# Patient Record
Sex: Female | Born: 2000 | Race: Black or African American | Hispanic: No | Marital: Single | Smoking: Never smoker
Health system: Southern US, Community
[De-identification: ages and names within clinical notes are randomized; demographics above are authoritative.]

## PROBLEM LIST (undated history)

## (undated) DIAGNOSIS — D573 Sickle-cell trait: Secondary | ICD-10-CM

## (undated) DIAGNOSIS — H6690 Otitis media, unspecified, unspecified ear: Secondary | ICD-10-CM

## (undated) DIAGNOSIS — T7840XA Allergy, unspecified, initial encounter: Secondary | ICD-10-CM

## (undated) HISTORY — DX: Allergy, unspecified, initial encounter: T78.40XA

## (undated) HISTORY — DX: Otitis media, unspecified, unspecified ear: H66.90

## (undated) HISTORY — DX: Sickle-cell trait: D57.3

## (undated) HISTORY — PX: NO PAST SURGERIES: SHX2092

---
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
## 2019-02-19 ENCOUNTER — Ambulatory Visit: Payer: 59 | Admitting: Family Medicine

## 2019-03-14 ENCOUNTER — Other Ambulatory Visit: Payer: Self-pay

## 2019-03-14 DIAGNOSIS — Z20822 Contact with and (suspected) exposure to covid-19: Secondary | ICD-10-CM

## 2019-03-16 LAB — NOVEL CORONAVIRUS, NAA: SARS-CoV-2, NAA: NOT DETECTED

## 2019-03-21 ENCOUNTER — Ambulatory Visit: Payer: 59 | Admitting: Family Medicine

## 2019-04-02 ENCOUNTER — Other Ambulatory Visit: Payer: Self-pay

## 2019-04-02 ENCOUNTER — Other Ambulatory Visit (HOSPITAL_COMMUNITY)
Admission: RE | Admit: 2019-04-02 | Discharge: 2019-04-02 | Disposition: A | Discharge status: Home / Self Care | Payer: 59 | Source: Ambulatory Visit | Attending: Family Medicine | Admitting: Family Medicine

## 2019-04-02 ENCOUNTER — Encounter: Payer: Self-pay | Admitting: Family Medicine

## 2019-04-02 ENCOUNTER — Ambulatory Visit (INDEPENDENT_AMBULATORY_CARE_PROVIDER_SITE_OTHER): Payer: 59 | Admitting: Family Medicine

## 2019-04-02 VITALS — BP 128/81 | HR 83 | Temp 98.0°F | Resp 16 | Ht 61.03 in | Wt 157.6 lb

## 2019-04-02 DIAGNOSIS — Z114 Encounter for screening for human immunodeficiency virus [HIV]: Secondary | ICD-10-CM | POA: Diagnosis not present

## 2019-04-02 DIAGNOSIS — Z23 Encounter for immunization: Secondary | ICD-10-CM | POA: Diagnosis not present

## 2019-04-02 DIAGNOSIS — Z0001 Encounter for general adult medical examination with abnormal findings: Secondary | ICD-10-CM

## 2019-04-02 DIAGNOSIS — D242 Benign neoplasm of left breast: Secondary | ICD-10-CM

## 2019-04-02 DIAGNOSIS — N898 Other specified noninflammatory disorders of vagina: Secondary | ICD-10-CM

## 2019-04-02 DIAGNOSIS — Z124 Encounter for screening for malignant neoplasm of cervix: Secondary | ICD-10-CM

## 2019-04-02 DIAGNOSIS — Z0000 Encounter for general adult medical examination without abnormal findings: Secondary | ICD-10-CM

## 2019-04-02 DIAGNOSIS — Z113 Encounter for screening for infections with a predominantly sexual mode of transmission: Secondary | ICD-10-CM

## 2019-04-02 LAB — POCT WET + KOH PREP
Trich by wet prep: ABSENT
Yeast by wet prep: ABSENT

## 2019-04-02 MED ORDER — FLUCONAZOLE 150 MG PO TABS: 150.0000 mg | ORAL_TABLET | Freq: Once | ORAL | 0 refills | Status: AC

## 2019-04-02 MED ORDER — METRONIDAZOLE 500 MG PO TABS: 500.0000 mg | ORAL_TABLET | Freq: Two times a day (BID) | ORAL | 0 refills | Status: DC

## 2019-04-02 NOTE — Patient Instructions (Signed)
Immunization Schedule, 2-19 Years Old Vaccines are usually given at various ages, according to a schedule. Your health care provider will recommend vaccines for you based on your age, medical history, and lifestyle or other factors, such as travel or where you work. You may receive vaccines as individual doses or as more than one vaccine together in one shot (combination vaccine). Talk with your health care provider about the risks and benefits of combination vaccines. Before you get a vaccine: Talk with your health care provider about which vaccines are right for you. This is especially important if:  You previously had a reaction after getting a vaccine.  You have a weakened disease-fighting system (immune system). You may have a weakened immune system if you: ? Are taking medicines that reduce (suppress) the activity of your immune system. ? Are taking medicines to treat cancer (chemotherapy). ? Have HIV or AIDS.  You work in an environment where you may be exposed to a disease.  You plan to travel outside of the country.  You have a chronic illness, such as heart disease, kidney disease, diabetes, or lung disease. Recommended immunizations for 33-100 years old Influenza vaccine This may also be called the IIV, RIV, or LAIV vaccine.  You should get a dose of the influenza vaccine every year. Tetanus, diphtheria, and pertussis vaccine This is also known as the Tdap vaccine. You should get 1 dose of the Tdap vaccine if:  You have not previously gotten a Tdap vaccine.  You do not know if you have ever gotten a Tdap vaccine. Women should get 1 dose of the Tdap vaccine during each pregnancy. It is recommended that pregnant women receive this vaccine between 27 and 36 weeks of pregnancy. Tetanus, diphtheria vaccine This is also known as the Td vaccine.  You should get a dose of the Td vaccine every 10 years after 1 dose of Tdap vaccine. Measles, mumps, and rubella vaccine This is  also known as the MMR vaccine. You may need to get the MMR vaccine if:  You need to catch up on doses you missed in the past.  You have not been given the vaccine before.  You do not have evidence of immunity (by a blood test). Pregnant women should not get the MMR vaccine during pregnancy because it may be harmful to the unborn baby. However, if you are not immune to measles, mumps, or rubella, you should get a dose of MMR vaccine within days after delivery. Varicella vaccine This is also known as the VAR vaccine. You may need to get the VAR vaccine if:  You need to catch up on doses you missed in the past.  You have not been given the vaccine before.  You do not have evidence of immunity (by a blood test).  You have certain high-risk conditions, such as HIV or AIDS. Pregnant women should not get the VAR vaccine during pregnancy because it may be harmful to the unborn baby. However, if you are not immune to chickenpox (varicella), you should get a dose of the VAR vaccine within days after delivery. Human papillomavirus vaccine This is also known as the HPV vaccine. You should get the HPV vaccine if:  You have not gotten the vaccine before. The vaccine is recommended for all adults through age 24.  You need to catch up on doses you missed in the past. Pneumococcal conjugate vaccine This is also known as the PCV13 vaccine. You should get the PCV13 vaccine as recommended if you  have certain high-risk conditions. These include:  Diabetes.  Chronic conditions of the heart, lungs, or liver.  Conditions that affect the immune system. Pneumococcal polysaccharide vaccine This is also known as the PPSV23 vaccine. You should get the PPSV23 vaccine as recommended if you have certain high-risk conditions. These include:  Diabetes.  Chronic conditions of the heart, lungs, or liver.  Conditions that affect the immune system. Hepatitis A vaccine This is also known as the HepA vaccine.  If you did not get the HepA vaccine previously, you should get it if:  You are at risk for a hepatitis A infection. You may be at risk for infection if you: ? Have chronic liver disease. ? Have HIV or AIDS. ? Are a man who has sex with men. ? Use illegal drugs. ? Are homeless. ? May be exposed to hepatitis A through work. ? Travel to countries where hepatitis A is common. ? Are pregnant. ? Have or will have close contact with someone who was adopted from another country.  You are not at risk for infection but want protection from hepatitis A. Hepatitis B vaccine This is also known as the HepB vaccine. If you did not get the HepB vaccine previously, you should get it if:  You are at risk for hepatitis B infection. You are at risk if you: ? Have chronic liver disease. ? Have HIV or AIDS. ? Have sex with a partner who has hepatitis B, or:  You have multiple sex partners.  You are a man who has sex with men. ? Use illegal drugs. ? May be exposed to hepatitis B through work. ? Live with someone who has hepatitis B. ? Receive dialysis treatment. ? Have diabetes mellitus. ? Travel to countries where hepatitis B is common. ? Are pregnant.  You are not at risk of infection but want protection from hepatitis B. Meningococcal conjugate vaccine This is also known as the MenACWY vaccine. You may need to get the MenACWY vaccine if you:  Have not been given the vaccine before.  Need to catch up on doses you missed in the past. This vaccine is especially important if you:  Do not have a spleen.  Have sickle cell disease.  Have HIV.  Take medicines that suppress your immune system.  Travel to countries where meningococcal disease is common.  Are exposed to Neisseria meningitidis at work. Serogroup B meningococcal vaccine This is also known as the MenB vaccine. You may need to get the MenB vaccine if you:  Have not been given the vaccine before.  Need to catch up on doses  you missed in the past. This vaccine is especially important if you:  Do not have a spleen.  Have sickle cell disease.  Take medicines that suppress your immune system.  Are exposed to Neisseria meningitidis at work. Haemophilus influenzae type b vaccine This is also known as the Hib vaccine. Anyone older than 19 years of age is usually not given the Hib vaccine. However, if you have certain high-risk conditions, you may need to get this vaccine. These conditions include:  Not having a spleen.  Having received a stem cell transplant. Summary  Before you get a vaccine, tell your health care provider if you have reacted to vaccines in the past or have a condition that weakens your immune system.  At 19-21 years, you should get a dose of the flu vaccine every year and a dose of the Td vaccine if 10 years have passed since  you had a Tdap vaccine.  Depending on your medical history and your risk factors, you may also need other vaccines. Ask your health care provider whether you are up to date on all your vaccines.  Women who are pregnant may not receive certain vaccines. Ask your health care provider whether you should receive any vaccines soon after you deliver your baby. This information is not intended to replace advice given to you by your health care provider. Make sure you discuss any questions you have with your health care provider. Document Released: 06/26/2003 Document Revised: 07/27/2018 Document Reviewed: 06/23/2018 [employer] Patient Education  [place].

## 2019-04-02 NOTE — Progress Notes (Signed)
Chief Complaint  Patient presents with  . Annual Exam    cpe w/ pap    Subjective:  Lynn Roth is a 18 y.o. female here for a health maintenance visit.  Patient is established pt  She would like to get a follow up for her breast lump on the left side.  She denies any changes in the lump.   No nipple discharge   She has a vaginal discharge   Patient Active Problem List   Diagnosis Date Noted  . Left breast lump 04/13/2018  . Chronic allergic rhinitis 04/13/2018  . Nexplanon in place 04/13/2018  . Seasonal allergies 04/05/2012  . Eczema 04/05/2012    Past Medical History:  Diagnosis Date  . Allergy started 08/04/2007  . Otitis media 03/19/2005    No past surgical history on file.   Outpatient Medications Prior to Visit  Medication Sig Dispense Refill  . Azelastine HCl 0.15 % SOLN Place 2 sprays into both nostrils 2 (two) times daily. 30 mL 5  . levocetirizine (XYZAL) 5 MG tablet Take 1 tablet (5 mg total) by mouth every evening. 30 tablet 11  . Azelastine-Fluticasone (DYMISTA) 137-50 MCG/ACT SUSP Place 2 sprays into both nostrils 1 day or 1 dose. (Patient not taking: Reported on 04/02/2019) 1 Bottle 5  . fluticasone (FLONASE) 50 MCG/ACT nasal spray Place 2 sprays into both nostrils daily. (Patient not taking: Reported on 04/02/2019) 16 g 5   No facility-administered medications prior to visit.    No Known Allergies   Family History  Problem Relation Age of Onset  . Asthma Brother   . Hypertension Mother 32       medication.  . Hypertension Maternal Aunt   . Sickle cell anemia Maternal Uncle   . Diabetes Maternal Grandmother   . Hypertension Maternal Grandfather   . Vision loss Maternal Grandfather        glacoma  . Hypertension Paternal Grandmother   . Kidney disease Neg Hx   . Stroke Neg Hx   . Hyperlipidemia Neg Hx      Health Habits: Dental Exam: up to date Eye Exam: up to date Exercise: 3 times/week on average Current exercise activities:  walking/running Diet: balanced  Social History   Socioeconomic History  . Marital status: Single    Spouse name: Not on file  . Number of children: Not on file  . Years of education: Not on file  . Highest education level: Not on file  Occupational History  . Not on file  Tobacco Use  . Smoking status: Never Smoker  . Smokeless tobacco: Never Used  Substance and Sexual Activity  . Alcohol use: No  . Drug use: No  . Sexual activity: Never  Other Topics Concern  . Not on file  Social History Narrative   [employer]   7th grade   Basketball and cheerleading.   Social Determinants of Health   Financial Resource Strain:   . Difficulty of Paying Living Expenses: Not on file  Food Insecurity:   . Worried About Charity fundraiser in the Last Year: Not on file  . Ran Out of Food in the Last Year: Not on file  Transportation Needs:   . Lack of Transportation (Medical): Not on file  . Lack of Transportation (Non-Medical): Not on file  Physical Activity:   . Days of Exercise per Week: Not on file  . Minutes of Exercise per Session: Not on file  Stress:   . Feeling  of Stress : Not on file  Social Connections:   . Frequency of Communication with Friends and Family: Not on file  . Frequency of Social Gatherings with Friends and Family: Not on file  . Attends Religious Services: Not on file  . Active Member of Clubs or Organizations: Not on file  . Attends Archivist Meetings: Not on file  . Marital Status: Not on file  Intimate Partner Violence:   . Fear of Current or Ex-Partner: Not on file  . Emotionally Abused: Not on file  . Physically Abused: Not on file  . Sexually Abused: Not on file   Social History   Substance and Sexual Activity  Alcohol Use No   Social History   Tobacco Use  Smoking Status Never Smoker  Smokeless Tobacco Never Used   Social History   Substance and Sexual Activity  Drug Use No    GYN: Sexual Health Menstrual  status: regular menses LMP: Patient's last menstrual period was 03/04/2019. Last pap smear: see HM section History of abnormal pap smears:  Sexually active:  with female partner Current contraception: nexplanon  Health Maintenance: See under health Maintenance activity for review of completion dates as well. Immunization History  Administered Date(s) Administered  . DTaP December 08, 1999, 12/11/1999, 02/15/2000, 11/28/2000, 11/08/2003  . Hepatitis A 08/04/2007, 12/31/2008  . Hepatitis B Apr 09, 2000, 11/09/1999, 07/18/2000  . HiB (PRP-OMP) 12/11/1999, 02/15/2000, 03/28/2000, 10/10/2000  . IPV 12/11/1999, 02/15/2000, 10/10/2000, 11/28/2000  . Influenza Split 01/07/2011, 01/05/2012  . Influenza,inj,Quad PF,6+ Mos 01/28/2018, 04/02/2019  . MMR 10/10/2000, 11/08/2003  . Meningococcal Conjugate 01/05/2012  . Pneumococcal Conjugate-13 12/11/1999, 02/15/2000, 03/28/2000  . Tdap 12/03/2010  . Varicella 10/10/2000, 08/04/2007      Depression Screen-PHQ2/9 Depression screen [facility] 2/9 04/02/2019 04/13/2018  Decreased Interest 0 0  Down, Depressed, Hopeless 0 0  PHQ - 2 Score 0 0       Depression Severity and Treatment Recommendations:  0-4= None  5-9= Mild / Treatment: Support, educate to call if worse; return in one month  10-14= Moderate / Treatment: Support, watchful waiting; Antidepressant or Psycotherapy  15-19= Moderately severe / Treatment: Antidepressant OR Psychotherapy  >= 20 = Major depression, severe / Antidepressant AND Psychotherapy    Review of Systems   ROS  See HPI for ROS as well.   Review of Systems  Constitutional: Negative for activity change, appetite change, chills and fever.  HENT: Negative for congestion, nosebleeds, trouble swallowing and voice change.   Respiratory: Negative for cough, shortness of breath and wheezing.   Gastrointestinal: Negative for diarrhea, nausea and vomiting.  Genitourinary: Negative for difficulty urinating, dysuria, flank pain and  hematuria.  Musculoskeletal: Negative for back pain, joint swelling and neck pain.  Neurological: Negative for dizziness, speech difficulty, light-headedness and numbness.  See HPI. All other review of systems negative.    Objective:   Vitals:   04/02/19 1342  BP: 128/81  Pulse: 83  Resp: 16  Temp: 98 F (36.7 C)  TempSrc: Oral  SpO2: 99%  Weight: 157 lb 9.6 oz (71.5 kg)  Height: 5' 1.03" (1.55 m)    Body mass index is 29.75 kg/m.  Physical Exam Exam conducted with a chaperone present.  Constitutional:      Appearance: Normal appearance. She is normal weight.  HENT:     Head: Normocephalic and atraumatic.     Nose: Nose normal. No congestion.  Eyes:     Extraocular Movements: Extraocular movements intact.     Conjunctiva/sclera: Conjunctivae normal.  Cardiovascular:     Rate and Rhythm: Normal rate and regular rhythm.     Pulses: Normal pulses.  Pulmonary:     Effort: Pulmonary effort is normal. No respiratory distress.     Breath sounds: Normal breath sounds. No stridor. No wheezing, rhonchi or rales.  Chest:     Chest wall: No tenderness.  Abdominal:     General: Abdomen is flat. Bowel sounds are normal. There is no distension.     Palpations: Abdomen is soft. There is no mass.     Tenderness: There is no abdominal tenderness. There is no guarding or rebound.     Hernia: No hernia is present.  Skin:    General: Skin is warm.     Capillary Refill: Capillary refill takes less than 2 seconds.  Neurological:     Mental Status: She is alert and oriented to person, place, and time. Mental status is at baseline.  Psychiatric:        Mood and Affect: Mood normal.        Behavior: Behavior normal.        Thought Content: Thought content normal.        Judgment: Judgment normal.   Breast and GU exam  Breast with palpable mass about 1cm-2cm at 1 o'clock Without lymph node pain  Vaginal exam- Chaperone Present Labia normal bilaterally without skin lesions  Urethral meatus normal appearing without erythema Vagina with thick white discharge No CMT, ovaries small and not palpable Uterus midline, nontender     Assessment/Plan:   Patient was seen for a health maintenance exam.  Counseled the patient on health maintenance issues. Reviewed her health mainteance schedule and ordered appropriate tests (see orders.) Counseled on regular exercise and weight management. Recommend regular eye exams and dental cleaning.   The following issues were addressed today for health maintenance:   Kattie was seen today for annual exam.  Diagnoses and all orders for this visit:  Encounter for annual physical examination excluding gynecological examination in a patient older than 5 years- Women's Health Maintenance Plan Advised monthly breast exam and follow up mammogram  Advised dental exam every six months Discussed stress management Discussed pap smear screening guidelines   Screening for HIV (human immunodeficiency virus) -     HIV antibody (with reflex)  Need for prophylactic vaccination and inoculation against influenza -     Flu Vaccine QUAD 36+ mos IM  Screening for cervical cancer- discussed that she is not due for cervical cancer screening until age 52  Screen for STD (sexually transmitted disease) -     HIV antibody (with reflex) -     Hepatitis B surface antigen -     RPR  Vaginal discharge- will screen for stds Wet prep showed BV and yeast Treatment described -     Hepatitis B surface antigen -     GC/Chlamydia probe amp ([place])not at [facility] -     POCT Wet + KOH Prep  Fibroadenoma of breast, left -  Continue to monitor with regular breast exam -     US BREAST LTD UNI LEFT INC AXILLA; Future    No follow-ups on file.    Body mass index is 29.75 kg/m.:  Discussed the patient's BMI with patient. The BMI body mass index is 29.75 kg/m.     Future Appointments  Date Time Provider [place]  04/25/2019 10:30 AM  [facility] [place] [facility]    Patient Instructions  Immunization Schedule, 19-21  Years Old Vaccines are usually given at various ages, according to a schedule. Your health care provider will recommend vaccines for you based on your age, medical history, and lifestyle or other factors, such as travel or where you work. You may receive vaccines as individual doses or as more than one vaccine together in one shot (combination vaccine). Talk with your health care provider about the risks and benefits of combination vaccines. Before you get a vaccine: Talk with your health care provider about which vaccines are right for you. This is especially important if:  You previously had a reaction after getting a vaccine.  You have a weakened disease-fighting system (immune system). You may have a weakened immune system if you: ? Are taking medicines that reduce (suppress) the activity of your immune system. ? Are taking medicines to treat cancer (chemotherapy). ? Have HIV or AIDS.  You work in an environment where you may be exposed to a disease.  You plan to travel outside of the country.  You have a chronic illness, such as heart disease, kidney disease, diabetes, or lung disease. Recommended immunizations for 45-88 years old Influenza vaccine This may also be called the IIV, RIV, or LAIV vaccine.  You should get a dose of the influenza vaccine every year. Tetanus, diphtheria, and pertussis vaccine This is also known as the Tdap vaccine. You should get 1 dose of the Tdap vaccine if:  You have not previously gotten a Tdap vaccine.  You do not know if you have ever gotten a Tdap vaccine. Women should get 1 dose of the Tdap vaccine during each pregnancy. It is recommended that pregnant women receive this vaccine between 27 and 36 weeks of pregnancy. Tetanus, diphtheria vaccine This is also known as the Td vaccine.  You should get a dose of the Td vaccine every 10 years after 1 dose of  Tdap vaccine. Measles, mumps, and rubella vaccine This is also known as the MMR vaccine. You may need to get the MMR vaccine if:  You need to catch up on doses you missed in the past.  You have not been given the vaccine before.  You do not have evidence of immunity (by a blood test). Pregnant women should not get the MMR vaccine during pregnancy because it may be harmful to the unborn baby. However, if you are not immune to measles, mumps, or rubella, you should get a dose of MMR vaccine within days after delivery. Varicella vaccine This is also known as the VAR vaccine. You may need to get the VAR vaccine if:  You need to catch up on doses you missed in the past.  You have not been given the vaccine before.  You do not have evidence of immunity (by a blood test).  You have certain high-risk conditions, such as HIV or AIDS. Pregnant women should not get the VAR vaccine during pregnancy because it may be harmful to the unborn baby. However, if you are not immune to chickenpox (varicella), you should get a dose of the VAR vaccine within days after delivery. Human papillomavirus vaccine This is also known as the HPV vaccine. You should get the HPV vaccine if:  You have not gotten the vaccine before. The vaccine is recommended for all adults through age 55.  You need to catch up on doses you missed in the past. Pneumococcal conjugate vaccine This is also known as the PCV13 vaccine. You should get the PCV13 vaccine as recommended if you have certain high-risk  conditions. These include:  Diabetes.  Chronic conditions of the heart, lungs, or liver.  Conditions that affect the immune system. Pneumococcal polysaccharide vaccine This is also known as the PPSV23 vaccine. You should get the PPSV23 vaccine as recommended if you have certain high-risk conditions. These include:  Diabetes.  Chronic conditions of the heart, lungs, or liver.  Conditions that affect the immune system.  Hepatitis A vaccine This is also known as the HepA vaccine. If you did not get the HepA vaccine previously, you should get it if:  You are at risk for a hepatitis A infection. You may be at risk for infection if you: ? Have chronic liver disease. ? Have HIV or AIDS. ? Are a man who has sex with men. ? Use illegal drugs. ? Are homeless. ? May be exposed to hepatitis A through work. ? Travel to countries where hepatitis A is common. ? Are pregnant. ? Have or will have close contact with someone who was adopted from another country.  You are not at risk for infection but want protection from hepatitis A. Hepatitis B vaccine This is also known as the HepB vaccine. If you did not get the HepB vaccine previously, you should get it if:  You are at risk for hepatitis B infection. You are at risk if you: ? Have chronic liver disease. ? Have HIV or AIDS. ? Have sex with a partner who has hepatitis B, or:  You have multiple sex partners.  You are a man who has sex with men. ? Use illegal drugs. ? May be exposed to hepatitis B through work. ? Live with someone who has hepatitis B. ? Receive dialysis treatment. ? Have diabetes mellitus. ? Travel to countries where hepatitis B is common. ? Are pregnant.  You are not at risk of infection but want protection from hepatitis B. Meningococcal conjugate vaccine This is also known as the MenACWY vaccine. You may need to get the MenACWY vaccine if you:  Have not been given the vaccine before.  Need to catch up on doses you missed in the past. This vaccine is especially important if you:  Do not have a spleen.  Have sickle cell disease.  Have HIV.  Take medicines that suppress your immune system.  Travel to countries where meningococcal disease is common.  Are exposed to Neisseria meningitidis at work. Serogroup B meningococcal vaccine This is also known as the MenB vaccine. You may need to get the MenB vaccine if you:  Have  not been given the vaccine before.  Need to catch up on doses you missed in the past. This vaccine is especially important if you:  Do not have a spleen.  Have sickle cell disease.  Take medicines that suppress your immune system.  Are exposed to Neisseria meningitidis at work. Haemophilus influenzae type b vaccine This is also known as the Hib vaccine. Anyone older than 19 years of age is usually not given the Hib vaccine. However, if you have certain high-risk conditions, you may need to get this vaccine. These conditions include:  Not having a spleen.  Having received a stem cell transplant. Summary  Before you get a vaccine, tell your health care provider if you have reacted to vaccines in the past or have a condition that weakens your immune system.  At 19-21 years, you should get a dose of the flu vaccine every year and a dose of the Td vaccine if 10 years have passed since you had a  Tdap vaccine.  Depending on your medical history and your risk factors, you may also need other vaccines. Ask your health care provider whether you are up to date on all your vaccines.  Women who are pregnant may not receive certain vaccines. Ask your health care provider whether you should receive any vaccines soon after you deliver your baby. This information is not intended to replace advice given to you by your health care provider. Make sure you discuss any questions you have with your health care provider. Document Released: 06/26/2003 Document Revised: 07/27/2018 Document Reviewed: 06/23/2018 Elsevier Patient Education  [place].

## 2019-04-03 LAB — GC/CHLAMYDIA PROBE AMP (~~LOC~~) NOT AT ARMC
Chlamydia: NEGATIVE
Comment: NEGATIVE
Comment: NORMAL
Neisseria Gonorrhea: NEGATIVE

## 2019-04-03 LAB — HEPATITIS B SURFACE ANTIGEN: Hepatitis B Surface Ag: NEGATIVE

## 2019-04-03 LAB — HIV ANTIBODY (ROUTINE TESTING W REFLEX): HIV Screen 4th Generation wRfx: NONREACTIVE

## 2019-04-03 LAB — RPR: RPR Ser Ql: NONREACTIVE

## 2019-04-25 ENCOUNTER — Other Ambulatory Visit: Payer: 59

## 2019-04-27 ENCOUNTER — Telehealth (INDEPENDENT_AMBULATORY_CARE_PROVIDER_SITE_OTHER): Payer: 59 | Admitting: Family Medicine

## 2019-04-27 ENCOUNTER — Encounter: Payer: Self-pay | Admitting: Family Medicine

## 2019-04-27 ENCOUNTER — Other Ambulatory Visit: Payer: Self-pay

## 2019-04-27 VITALS — Temp 98.0°F | Ht 61.0 in

## 2019-04-27 DIAGNOSIS — U071 COVID-19: Secondary | ICD-10-CM

## 2019-04-27 DIAGNOSIS — J309 Allergic rhinitis, unspecified: Secondary | ICD-10-CM | POA: Diagnosis not present

## 2019-04-27 DIAGNOSIS — R0981 Nasal congestion: Secondary | ICD-10-CM | POA: Diagnosis not present

## 2019-04-27 MED ORDER — LEVOCETIRIZINE DIHYDROCHLORIDE 5 MG PO TABS: 5.0000 mg | ORAL_TABLET | Freq: Every evening | ORAL | 11 refills | Status: DC

## 2019-04-27 NOTE — Progress Notes (Signed)
DOXY.ME VIDEO Encounter- SOAP NOTE Established Patient  This DOXY.ME encounter was conducted with the patient's (or proxy's) verbal consent via audio telecommunications: yes/no: Yes Patient was instructed to have this encounter in a suitably private space; and to only have persons present to whom they give permission to participate. In addition, patient identity was confirmed by use of name plus two identifiers (DOB and address).  I discussed the limitations, risks, security and privacy concerns of performing an evaluation and management service by telephone and the availability of in person appointments. I also discussed with the patient that there may be a patient responsible charge related to this service. The patient expressed understanding and agreed to proceed.  I spent a total of TIME; 0 MIN TO 60 MIN: 15 minutes talking with the patient or their proxy.  CC: COVID symptoms  Subjective   Lynn Roth is a 19 y.o. established patient. Telephone visit today for  HPI  Patient tested positive for covid 04/24/2019 She was tested at [facility] Her symptoms started off as a cold  She was experiencing runny nose, sneezing, congestion, cough Her symptoms started 04/18/2019. She denies fevers She reports lack of taste or smell She is still congested She states like she has fluid in her head - it feels heavy but not quite a headache - like after she was in the pool.    Patient Active Problem List   Diagnosis Date Noted  . Left breast lump 04/13/2018  . Chronic allergic rhinitis 04/13/2018  . Nexplanon in place 04/13/2018  . Seasonal allergies 04/05/2012  . Eczema 04/05/2012    Past Medical History:  Diagnosis Date  . Allergy started 08/04/2007  . Otitis media 03/19/2005    Current Outpatient Medications  Medication Sig Dispense Refill  . Azelastine HCl 0.15 % SOLN Place 2 sprays into both nostrils 2 (two) times daily. 30 mL 5  . levocetirizine (XYZAL) 5 MG tablet Take 1 tablet  (5 mg total) by mouth every evening. 30 tablet 11  . metroNIDAZOLE (FLAGYL) 500 MG tablet Take 1 tablet (500 mg total) by mouth 2 (two) times daily. (Patient not taking: Reported on 04/27/2019) 14 tablet 0   No current facility-administered medications for this visit.    No Known Allergies  Social History   Socioeconomic History  . Marital status: Single    Spouse name: Not on file  . Number of children: Not on file  . Years of education: Not on file  . Highest education level: Not on file  Occupational History  . Not on file  Tobacco Use  . Smoking status: Never Smoker  . Smokeless tobacco: Never Used  Substance and Sexual Activity  . Alcohol use: No  . Drug use: No  . Sexual activity: Never  Other Topics Concern  . Not on file  Social History Narrative   [employer]   7th grade   Basketball and cheerleading.   Social Determinants of Health   Financial Resource Strain:   . Difficulty of Paying Living Expenses: Not on file  Food Insecurity:   . Worried About Charity fundraiser in the Last Year: Not on file  . Ran Out of Food in the Last Year: Not on file  Transportation Needs:   . Lack of Transportation (Medical): Not on file  . Lack of Transportation (Non-Medical): Not on file  Physical Activity:   . Days of Exercise per Week: Not on file  . Minutes of Exercise per Session:  Not on file  Stress:   . Feeling of Stress : Not on file  Social Connections:   . Frequency of Communication with Friends and Family: Not on file  . Frequency of Social Gatherings with Friends and Family: Not on file  . Attends Religious Services: Not on file  . Active Member of Clubs or Organizations: Not on file  . Attends Archivist Meetings: Not on file  . Marital Status: Not on file  Intimate Partner Violence:   . Fear of Current or Ex-Partner: Not on file  . Emotionally Abused: Not on file  . Physically Abused: Not on file  . Sexually Abused: Not on file     ROS Review of Systems  Constitutional: Negative for activity change, appetite change, chills and fever.  HENT: +congestion, no nosebleeds, trouble swallowing and voice change.   Respiratory: Negative for cough, shortness of breath and wheezing.   Gastrointestinal: Negative for diarrhea, nausea and vomiting. + loose stools Genitourinary: Negative for difficulty urinating, dysuria, flank pain and hematuria.  Musculoskeletal: Negative for back pain, joint swelling and neck pain.  Neurological: Negative for dizziness, speech difficulty, light-headedness and numbness.  See HPI. All other review of systems negative.   Objective   Vitals as reported by the patient: Today's Vitals   04/27/19 0911  Temp: 98 F (36.7 C)  TempSrc: Temporal  Height: 5\' 1"  (1.549 m)   Physical Exam  Constitutional: Oriented to person, place, and time. Appears well-developed and well-nourished.  HENT:  Head: Normocephalic and atraumatic.  Eyes: Conjunctivae and EOM are normal.  Pulmonary/Chest: Effort normal Neurological: Is alert and oriented to person, place, and time.  Skin: Skin is warm. Capillary refill takes less than 2 seconds.  Psychiatric: Has a normal mood and affect. Behavior is normal. Judgment and thought content normal.   Diagnoses and all orders for this visit:  COVID-19 virus infection  Nasal congestion   Discussed symptomatic treatment with xyzal and nasal spray Continue rest and hydration Gave work excuse note   I discussed the assessment and treatment plan with the patient. The patient was provided an opportunity to ask questions and all were answered. The patient agreed with the plan and demonstrated an understanding of the instructions.   The patient was advised to call back or seek an in-person evaluation if the symptoms worsen or if the condition fails to improve as anticipated.  I provided 15  minutes of doxy.me face-to-face time during this encounter.  Forrest Moron, MD  Primary Care at [facility]

## 2019-04-27 NOTE — Progress Notes (Signed)
Lingering Covid Sx. (going on since Sunday)  DX: test pos. on Tue 04/24/2019  ~ feels like she has swimmers ear ~no taste or smell.

## 2019-04-27 NOTE — Patient Instructions (Signed)
° ° ° °  If you have lab work done today you will be contacted with your lab results within the next 2 weeks.  If you have not heard from us then please contact us. The fastest way to get your results is to register for My Chart. ° ° °IF you received an x-ray today, you will receive an invoice from [facility]. Please contact [facility] at [phone] with questions or concerns regarding your invoice.  ° °IF you received labwork today, you will receive an invoice from [facility]. Please contact [facility] at [phone] with questions or concerns regarding your invoice.  ° °Our billing staff will not be able to assist you with questions regarding bills from these companies. ° °You will be contacted with the lab results as soon as they are available. The fastest way to get your results is to activate your My Chart account. Instructions are located on the last page of this paperwork. If you have not heard from us regarding the results in 2 weeks, please contact this office. °  ° ° ° °

## 2019-05-01 ENCOUNTER — Other Ambulatory Visit: Payer: 59

## 2019-05-17 ENCOUNTER — Other Ambulatory Visit: Payer: 59

## 2019-06-11 ENCOUNTER — Ambulatory Visit
Admission: RE | Admit: 2019-06-11 | Discharge: 2019-06-11 | Disposition: A | Discharge status: Home / Self Care | Payer: 59 | Source: Ambulatory Visit | Attending: Family Medicine | Admitting: Family Medicine

## 2019-06-11 ENCOUNTER — Other Ambulatory Visit: Payer: Self-pay

## 2019-06-11 DIAGNOSIS — D242 Benign neoplasm of left breast: Secondary | ICD-10-CM

## 2019-07-23 IMAGING — US ULTRASOUND LEFT BREAST LIMITED
1 series · 4 of 4 positions shown · non-contrast
Comparison: December 08, 2016 and June 23, 2017

CLINICAL DATA: 17-year-old patient presents for follow-up
ultrasound imaging of a probable fibroadenoma in the left breast 11
o'clock position retroareolar. This was first imaged in November 2016.
The patient reports no change or new concerns.

EXAM:
ULTRASOUND OF THE LEFT BREAST

[Series 1: ultrasound left breast limited · 0.06mm/px · 4 of 4 slices shown]
[im 1/4]
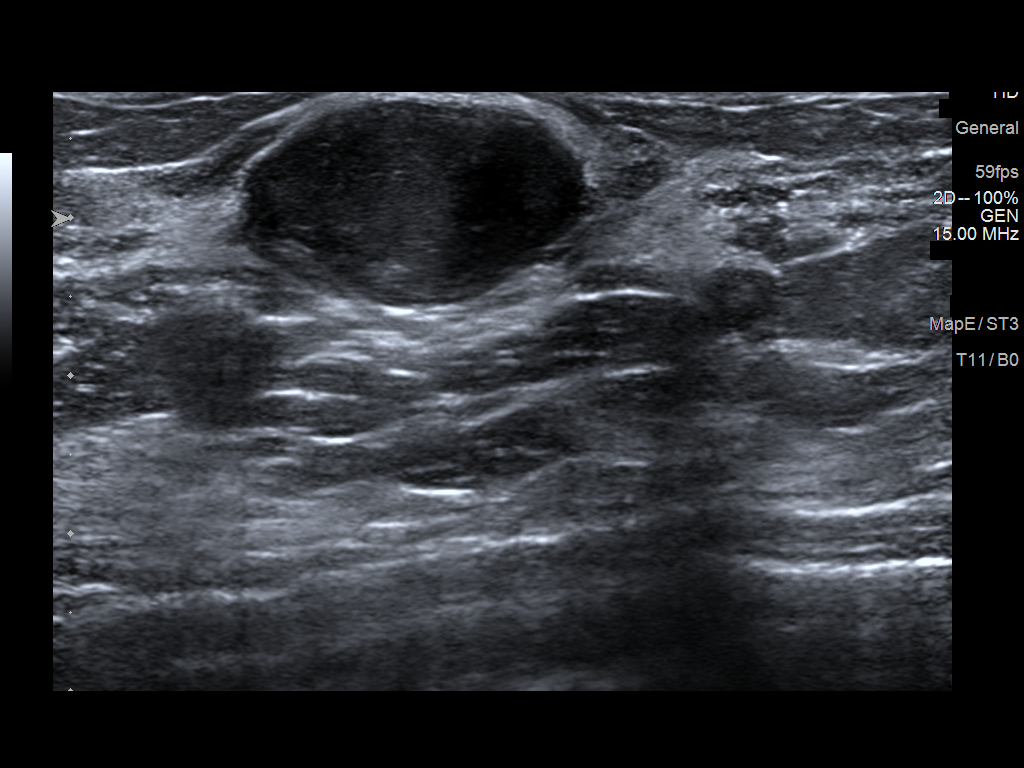
[im 2/4]
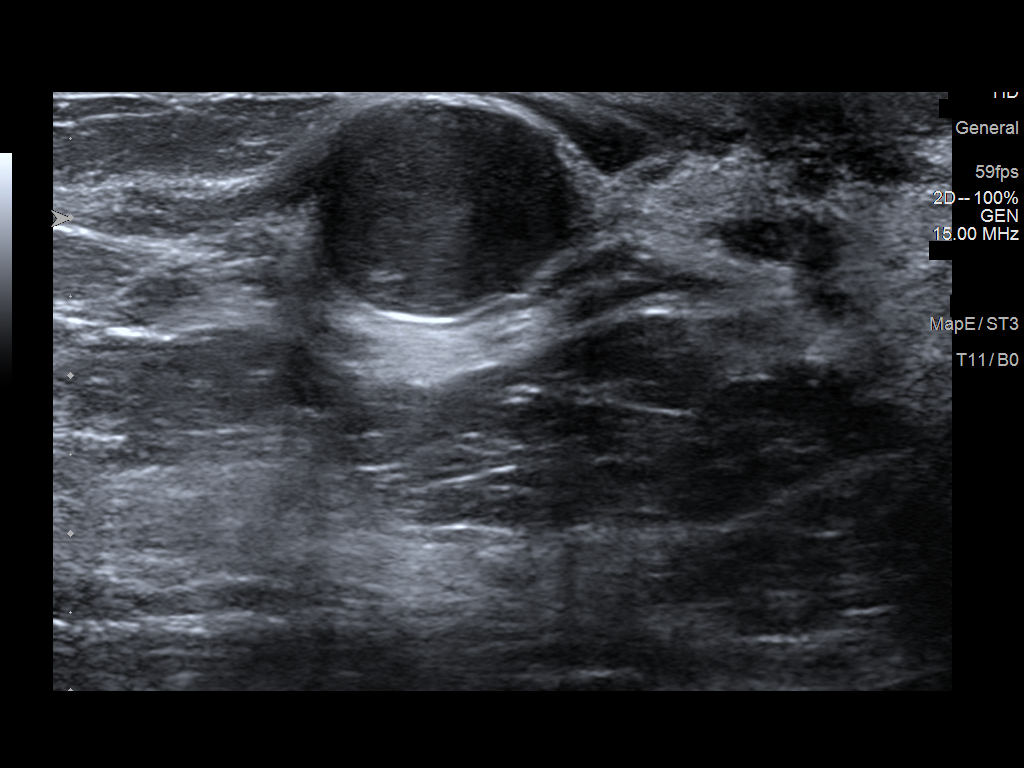
[im 3/4]
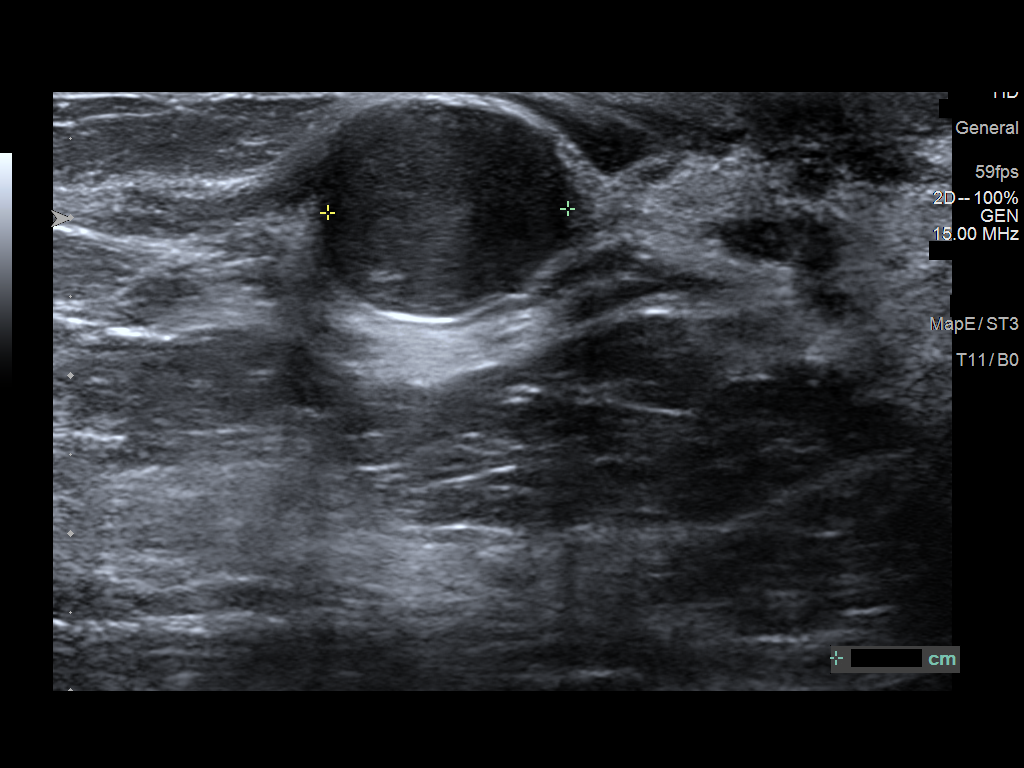
[im 4/4]
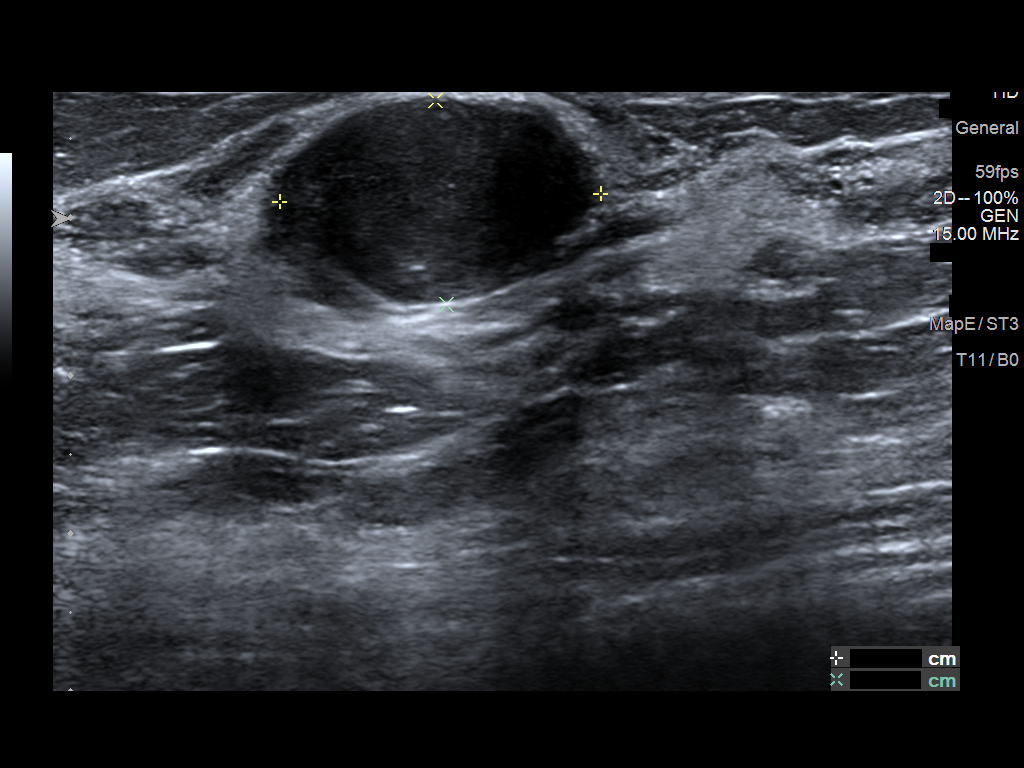

[4 of 4 positions shown; findings below may reference images not displayed]

FINDINGS: Targeted ultrasound is performed, showing a circumscribed oval
hypoechoic parallel mass at 11 o'clock position retroareolar
measuring 2.0 x 1.3 x 1.5 cm. This mass is stable and continues to
have appearances suggestive of benign fibroadenoma.
IMPRESSION: Stable probable fibroadenoma retroareolar left breast.

RECOMMENDATION:
Left breast ultrasound is recommended in November 2018 to complete a 2
year imaging follow-up.

I have discussed the findings and recommendations with the patient.
Results were also provided in writing at the conclusion of the
visit. If applicable, a reminder letter will be sent to the patient
regarding the next appointment.

BI-RADS CATEGORY  3: Probably benign.

## 2019-08-02 ENCOUNTER — Ambulatory Visit: Payer: 59 | Attending: Internal Medicine

## 2019-08-02 DIAGNOSIS — Z23 Encounter for immunization: Secondary | ICD-10-CM

## 2019-08-02 NOTE — Progress Notes (Signed)
   Covid-19 Vaccination Clinic  Name:  Lynn Roth    MRN: [id number] DOB: Oct 16, 2000  08/02/2019  Lynn Roth was observed post Covid-19 immunization for 15 minutes without incident. She was provided with Vaccine Information Sheet and instruction to access the V-Safe system.   Lynn Roth was instructed to call 911 with any severe reactions post vaccine: Marland Kitchen Difficulty breathing  . Swelling of face and throat  . A fast heartbeat  . A bad rash all over body  . Dizziness and weakness   Immunizations Administered    Name Date Dose VIS Date Route   Pfizer COVID-19 Vaccine 08/02/2019  8:12 AM 0.3 mL 03/30/2019 Intramuscular   Manufacturer: [place]   Lot: [id number]   [place]: [id number]

## 2019-08-27 ENCOUNTER — Ambulatory Visit: Payer: 59 | Attending: Internal Medicine

## 2019-08-27 DIAGNOSIS — Z23 Encounter for immunization: Secondary | ICD-10-CM

## 2019-08-27 NOTE — Progress Notes (Signed)
   Covid-19 Vaccination Clinic  Name:  Lynn Roth    MRN: [id number] DOB: 10/10/2000  08/27/2019  [name] was observed post Covid-19 immunization for 15 minutes without incident. She was provided with Vaccine Information Sheet and instruction to access the V-Safe system.   [name] was instructed to call 911 with any severe reactions post vaccine: Marland Kitchen Difficulty breathing  . Swelling of face and throat  . A fast heartbeat  . A bad rash all over body  . Dizziness and weakness   Immunizations Administered    Name Date Dose VIS Date Route   Pfizer COVID-19 Vaccine 08/27/2019  8:44 AM 0.3 mL 06/13/2018 Intramuscular   Manufacturer: [place]   Lot: [id number]   [place]: [id number]

## 2019-10-24 ENCOUNTER — Encounter (HOSPITAL_COMMUNITY): Payer: Self-pay | Admitting: Emergency Medicine

## 2019-10-24 ENCOUNTER — Other Ambulatory Visit: Payer: Self-pay

## 2019-10-24 ENCOUNTER — Ambulatory Visit (HOSPITAL_COMMUNITY)
Admission: EM | Admit: 2019-10-24 | Discharge: 2019-10-24 | Disposition: A | Discharge status: Home / Self Care | Payer: 59 | Attending: Family Medicine | Admitting: Family Medicine

## 2019-10-24 DIAGNOSIS — L03032 Cellulitis of left toe: Secondary | ICD-10-CM | POA: Diagnosis not present

## 2019-10-24 MED ORDER — LIDOCAINE-EPINEPHRINE-TETRACAINE (LET) TOPICAL GEL
TOPICAL | Status: AC
  Filled 2019-10-24: qty 3

## 2019-10-24 MED ORDER — SILVER NITRATE-POT NITRATE 75-25 % EX MISC
CUTANEOUS | Status: AC
  Filled 2019-10-24: qty 10

## 2019-10-24 MED ORDER — LIDOCAINE-EPINEPHRINE-TETRACAINE (LET) TOPICAL GEL
3.0000 mL | Freq: Once | TOPICAL | Status: AC
  Administered 2019-10-24: 3 mL via TOPICAL

## 2019-10-24 MED ORDER — CEPHALEXIN 500 MG PO CAPS
500.0000 mg | ORAL_CAPSULE | Freq: Two times a day (BID) | ORAL | 0 refills | Status: DC
Start: 2019-10-24 — End: 2021-03-10

## 2019-10-24 NOTE — Discharge Instructions (Signed)
Warm soaks 2 x a day  take the keflex as directed

## 2019-10-24 NOTE — ED Provider Notes (Signed)
[facility]    CSN: [id number] Arrival date & time: 10/24/19  1633      History   Chief Complaint Chief Complaint  Patient presents with  . Toe Injury    HPI Lynn Roth is a 19 y.o. female.   HPI  Patient's had a toenail infection for for 5 weeks.  She states is been painful and draining.  The last several days it has a swollen area at the edge of the nail that is tender and bleeds easily.  Past Medical History:  Diagnosis Date  . Allergy started 08/04/2007  . Otitis media 03/19/2005    Patient Active Problem List   Diagnosis Date Noted  . Left breast lump 04/13/2018  . Chronic allergic rhinitis 04/13/2018  . Nexplanon in place 04/13/2018  . Seasonal allergies 04/05/2012  . Eczema 04/05/2012    History reviewed. No pertinent surgical history.  OB History   No obstetric history on file.      Home Medications    Prior to Admission medications   Medication Sig Start Date End Date Taking? Authorizing Provider  Azelastine HCl 0.15 % SOLN Place 2 sprays into both nostrils 2 (two) times daily. 05/22/18   Kennith Gain, MD  cephALEXin (KEFLEX) 500 MG capsule Take 1 capsule (500 mg total) by mouth 2 (two) times daily. 10/24/19   Raylene Everts, MD  levocetirizine (XYZAL) 5 MG tablet Take 1 tablet (5 mg total) by mouth every evening. 04/27/19   Forrest Moron, MD    Family History Family History  Problem Relation Age of Onset  . Asthma Brother   . Hypertension Mother 47       medication.  . Hypertension Maternal Aunt   . Sickle cell anemia Maternal Uncle   . Diabetes Maternal Grandmother   . Hypertension Maternal Grandfather   . Vision loss Maternal Grandfather        glacoma  . Hypertension Paternal Grandmother   . Kidney disease Neg Hx   . Stroke Neg Hx   . Hyperlipidemia Neg Hx     Social History Social History   Tobacco Use  . Smoking status: Never Smoker  . Smokeless tobacco: Never Used  Substance Use Topics  . Alcohol  use: No  . Drug use: No     Allergies   Patient has no known allergies.   Review of Systems Review of Systems  See HPI Physical Exam Triage Vital Signs ED Triage Vitals  Enc Vitals Group     BP 10/24/19 1810 127/85     Pulse Rate 10/24/19 1810 73     Resp 10/24/19 1810 18     Temp 10/24/19 1810 99.2 F (37.3 C)     Temp Source 10/24/19 1810 Oral     SpO2 10/24/19 1810 100 %     Weight --      Height --      Head Circumference --      Peak Flow --      Pain Score 10/24/19 1807 5     Pain Loc --      Pain Edu? --      Excl. in [place]? --    No data found.  Updated Vital Signs BP 127/85 (BP Location: Right Arm)   Pulse 73   Temp 99.2 F (37.3 C) (Oral)   Resp 18   SpO2 100%   Visual Acuity Right Eye Distance:   Left Eye Distance:   Bilateral Distance:  Right Eye Near:   Left Eye Near:    Bilateral Near:     Physical Exam Constitutional:      General: She is not in acute distress.    Appearance: She is well-developed.  HENT:     Head: Normocephalic and atraumatic.     Nose:     Comments: Mask is placed Eyes:     Conjunctiva/sclera: Conjunctivae normal.     Pupils: Pupils are equal, round, and reactive to light.  Cardiovascular:     Rate and Rhythm: Normal rate.  Pulmonary:     Effort: Pulmonary effort is normal. No respiratory distress.  Abdominal:     General: There is no distension.     Palpations: Abdomen is soft.  Musculoskeletal:        General: Normal range of motion.     Cervical back: Normal range of motion.  Skin:    General: Skin is warm and dry.     Comments: Great toe on right foot has a perennial supply Derm and edge of the nail with some yellow discharge.  Neurological:     Mental Status: She is alert.      UC Treatments / Results  Labs (all labs ordered are listed, but only abnormal results are displayed) Labs Reviewed - No data to display  EKG   Radiology No results found.  Procedures Procedures (including  critical care time)  Medications Ordered in UC Medications  lidocaine-EPINEPHrine-tetracaine (LET) topical gel (3 mLs Topical Given 10/24/19 1847)    Initial Impression / Assessment and Plan / UC Course  I have reviewed the triage vital signs and the nursing notes.  Pertinent labs & imaging results that were available during my care of the patient were reviewed by me and considered in my medical decision making (see chart for details).     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  Treated with silver nitrate.  Wound care as discussed. Final Clinical Impressions(s) / UC Diagnoses   Final diagnoses:  Paronychia of great toe of left foot     Discharge Instructions     Warm soaks 2 x a day  take the keflex as directed   ED Prescriptions    Medication Sig Dispense Auth. Provider   cephALEXin (KEFLEX) 500 MG capsule Take 1 capsule (500 mg total) by mouth 2 (two) times daily. 10 capsule Raylene Everts, MD     PDMP not reviewed this encounter.   Raylene Everts, MD 10/24/19 1919

## 2019-10-24 NOTE — ED Triage Notes (Signed)
Right great toe is painful.  Reports at last pedicure, too much skin cut.  Last appt was about 3 weeks ago.

## 2020-01-14 ENCOUNTER — Ambulatory Visit: Payer: 59 | Admitting: Registered Nurse

## 2020-01-22 ENCOUNTER — Encounter: Payer: 59 | Admitting: Registered Nurse

## 2021-02-16 ENCOUNTER — Emergency Department (HOSPITAL_COMMUNITY): Payer: 59

## 2021-02-16 ENCOUNTER — Encounter (HOSPITAL_COMMUNITY): Payer: Self-pay

## 2021-02-16 ENCOUNTER — Other Ambulatory Visit: Payer: Self-pay

## 2021-02-16 ENCOUNTER — Emergency Department (HOSPITAL_COMMUNITY)
Admission: EM | Admit: 2021-02-16 | Discharge: 2021-02-16 | Disposition: A | Discharge status: Home / Self Care | Payer: 59 | Attending: Emergency Medicine | Admitting: Emergency Medicine

## 2021-02-16 DIAGNOSIS — M546 Pain in thoracic spine: Secondary | ICD-10-CM | POA: Insufficient documentation

## 2021-02-16 DIAGNOSIS — Y92410 Unspecified street and highway as the place of occurrence of the external cause: Secondary | ICD-10-CM | POA: Insufficient documentation

## 2021-02-16 DIAGNOSIS — R519 Headache, unspecified: Secondary | ICD-10-CM | POA: Insufficient documentation

## 2021-02-16 DIAGNOSIS — M545 Low back pain, unspecified: Secondary | ICD-10-CM | POA: Insufficient documentation

## 2021-02-16 LAB — PREGNANCY, URINE: Preg Test, Ur: NEGATIVE

## 2021-02-16 MED ORDER — NAPROXEN 375 MG PO TABS: 375.0000 mg | ORAL_TABLET | Freq: Two times a day (BID) | ORAL | 0 refills | Status: DC

## 2021-02-16 MED ORDER — LIDOCAINE 5 % EX PTCH: 1.0000 | MEDICATED_PATCH | CUTANEOUS | 0 refills | Status: DC

## 2021-02-16 MED ORDER — ACETAMINOPHEN 500 MG PO TABS
1000.0000 mg | ORAL_TABLET | Freq: Once | ORAL | Status: AC
  Administered 2021-02-16: 1000 mg via ORAL
  Filled 2021-02-16: qty 2

## 2021-02-16 MED ORDER — LIDOCAINE 5 % EX PTCH
1.0000 | MEDICATED_PATCH | CUTANEOUS | Status: DC
  Administered 2021-02-16: 1 via TRANSDERMAL
  Filled 2021-02-16: qty 1
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
## 2021-02-16 NOTE — Discharge Instructions (Addendum)
Use Lidoderm patches as needed for pain relief.  Take the naproxen twice daily for the next 7 days, this is an anti-inflammatory medicine that will help with pain.  Take it with food and water to reduce the chance of getting ulcers.  Follow-up with a PCP in 2 weeks if there is no improvement.  Return to the ED if things change or worsen.

## 2021-03-10 ENCOUNTER — Telehealth: Payer: 59 | Admitting: Physician Assistant

## 2021-03-10 DIAGNOSIS — H109 Unspecified conjunctivitis: Secondary | ICD-10-CM | POA: Diagnosis not present

## 2021-03-10 DIAGNOSIS — B9689 Other specified bacterial agents as the cause of diseases classified elsewhere: Secondary | ICD-10-CM

## 2021-03-10 MED ORDER — POLYMYXIN B-TRIMETHOPRIM 10000-0.1 UNIT/ML-% OP SOLN: 1.0000 [drp] | OPHTHALMIC | 0 refills | Status: DC

## 2021-03-10 MED ORDER — AMOXICILLIN-POT CLAVULANATE 875-125 MG PO TABS: 1.0000 | ORAL_TABLET | Freq: Two times a day (BID) | ORAL | 0 refills | Status: DC

## 2021-03-10 NOTE — Progress Notes (Signed)

## 2021-03-10 NOTE — Progress Notes (Signed)

## 2021-04-07 ENCOUNTER — Other Ambulatory Visit: Payer: Self-pay

## 2021-04-07 ENCOUNTER — Encounter: Payer: Self-pay | Admitting: Emergency Medicine

## 2021-04-07 ENCOUNTER — Ambulatory Visit: Payer: 59 | Admitting: Emergency Medicine

## 2021-04-07 VITALS — BP 118/60 | HR 91 | Temp 98.6°F | Ht 61.0 in | Wt 184.0 lb

## 2021-04-07 DIAGNOSIS — Z0000 Encounter for general adult medical examination without abnormal findings: Secondary | ICD-10-CM

## 2021-04-07 DIAGNOSIS — Z1329 Encounter for screening for other suspected endocrine disorder: Secondary | ICD-10-CM | POA: Diagnosis not present

## 2021-04-07 DIAGNOSIS — Z13220 Encounter for screening for lipoid disorders: Secondary | ICD-10-CM | POA: Diagnosis not present

## 2021-04-07 DIAGNOSIS — Z130 Encounter for screening for diseases of the blood and blood-forming organs and certain disorders involving the immune mechanism: Secondary | ICD-10-CM

## 2021-04-07 DIAGNOSIS — B3731 Acute candidiasis of vulva and vagina: Secondary | ICD-10-CM

## 2021-04-07 DIAGNOSIS — Z13228 Encounter for screening for other metabolic disorders: Secondary | ICD-10-CM

## 2021-04-07 DIAGNOSIS — Z1159 Encounter for screening for other viral diseases: Secondary | ICD-10-CM | POA: Diagnosis not present

## 2021-04-07 DIAGNOSIS — K602 Anal fissure, unspecified: Secondary | ICD-10-CM

## 2021-04-07 LAB — CBC WITH DIFFERENTIAL/PLATELET
Basophils Absolute: 0.1 10*3/uL (ref 0.0–0.1)
Basophils Relative: 0.6 % (ref 0.0–3.0)
Eosinophils Absolute: 0.3 10*3/uL (ref 0.0–0.7)
Eosinophils Relative: 2.3 % (ref 0.0–5.0)
HCT: 35.1 % — ABNORMAL LOW (ref 36.0–46.0)
Hemoglobin: 11.9 g/dL — ABNORMAL LOW (ref 12.0–15.0)
Lymphocytes Relative: 25.2 % (ref 12.0–46.0)
Lymphs Abs: 2.9 10*3/uL (ref 0.7–4.0)
MCHC: 34 g/dL (ref 30.0–36.0)
MCV: 86.2 fl (ref 78.0–100.0)
Monocytes Absolute: 0.7 10*3/uL (ref 0.1–1.0)
Monocytes Relative: 6.3 % (ref 3.0–12.0)
Neutro Abs: 7.7 10*3/uL (ref 1.4–7.7)
Neutrophils Relative %: 65.6 % (ref 43.0–77.0)
Platelets: 387 10*3/uL (ref 150.0–400.0)
RBC: 4.08 Mil/uL (ref 3.87–5.11)
RDW: 13.9 % (ref 11.5–15.5)
WBC: 11.7 10*3/uL — ABNORMAL HIGH (ref 4.0–10.5)

## 2021-04-07 LAB — COMPREHENSIVE METABOLIC PANEL
ALT: 9 U/L (ref 0–35)
AST: 14 U/L (ref 0–37)
Albumin: 4.2 g/dL (ref 3.5–5.2)
Alkaline Phosphatase: 53 U/L (ref 39–117)
BUN: 9 mg/dL (ref 6–23)
CO2: 26 mEq/L (ref 19–32)
Calcium: 9.3 mg/dL (ref 8.4–10.5)
Chloride: 105 mEq/L (ref 96–112)
Creatinine, Ser: 0.6 mg/dL (ref 0.40–1.20)
GFR: 128.24 mL/min (ref >60.00)
Glucose, Bld: 105 mg/dL — ABNORMAL HIGH (ref 70–99)
Potassium: 3.6 mEq/L (ref 3.5–5.1)
Sodium: 137 mEq/L (ref 135–145)
Total Bilirubin: 0.4 mg/dL (ref 0.2–1.2)
Total Protein: 7.5 g/dL (ref 6.0–8.3)

## 2021-04-07 LAB — LIPID PANEL
Cholesterol: 139 mg/dL (ref 0–200)
HDL: 29.2 mg/dL — ABNORMAL LOW (ref >39.00)
LDL Cholesterol: 91 mg/dL (ref 0–99)
NonHDL: 109.75
Total CHOL/HDL Ratio: 5
Triglycerides: 96 mg/dL (ref 0.0–149.0)
VLDL: 19.2 mg/dL (ref 0.0–40.0)

## 2021-04-07 LAB — HEMOGLOBIN A1C: Hgb A1c MFr Bld: 4.4 % — ABNORMAL LOW (ref 4.6–6.5)

## 2021-04-07 MED ORDER — FLUCONAZOLE 150 MG PO TABS
150.0000 mg | ORAL_TABLET | Freq: Once | ORAL | 0 refills | Status: AC
Start: 2021-04-07 — End: 2021-04-07

## 2021-04-07 MED ORDER — AZELASTINE HCL 0.15 % NA SOLN: 2.0000 | Freq: Two times a day (BID) | NASAL | 5 refills | Status: DC

## 2021-04-07 NOTE — Patient Instructions (Signed)

## 2021-04-07 NOTE — Progress Notes (Signed)
Lynn Roth 20 y.o.   Chief Complaint  Patient presents with   New Patient (Initial Visit)    Pt states she that she may have a yeast infection due to taking antibiotic. Pt also states when she wipe after BM that there is blood, possible hemorrhoids.   Medication Refill    [email or web address]    HISTORY OF PRESENT ILLNESS: This is a 20 y.o. female former patient of Dr. Nolon Rod at [facility] here to establish care with me. Wants a physical exam. Recently took antibiotics and may have a yeast vaginal infection. May have a hemorrhoid or a anal fissure.  Occasional blood when she wipes. Also requesting medication refill for azelastine. Has no chronic medical problems.  No chronic medications. Non-smoker. No other complaints or medical concerns. Sees gynecologist on a regular basis. Unsure about HPV the vaccine status  Medication Refill Pertinent negatives include no abdominal pain, chest pain, chills, congestion, fever, headaches, nausea, rash, sore throat or vomiting.    Prior to Admission medications   Medication Sig Start Date End Date Taking? Authorizing Provider  Azelastine HCl 0.15 % SOLN Place 2 sprays into both nostrils 2 (two) times daily. 05/22/18  Yes Padgett, Rae Halsted, MD  levocetirizine (XYZAL) 5 MG tablet Take 1 tablet (5 mg total) by mouth every evening. 04/27/19  Yes Stallings, Zoe A, MD  lidocaine (LIDODERM) 5 % Place 1 patch onto the skin daily. Remove & Discard patch within 12 hours or as directed by MD Patient not taking: Reported on 04/07/2021 02/16/21   Sherrill Raring, PA-C  naproxen (NAPROSYN) 375 MG tablet Take 1 tablet (375 mg total) by mouth 2 (two) times daily. Patient not taking: Reported on 04/07/2021 02/16/21   Sherrill Raring, PA-C  trimethoprim-polymyxin b (POLYTRIM) ophthalmic solution Place 1 drop into the right eye every 4 (four) hours. X 5 days Patient not taking: Reported on 04/07/2021 03/10/21   Mar Daring, PA-C    No Known Allergies  Patient  Active Problem List   Diagnosis Date Noted   Chronic allergic rhinitis 04/13/2018   Nexplanon in place 04/13/2018   Heart murmur 09/04/2013   Seasonal allergies 04/05/2012    Past Medical History:  Diagnosis Date   Allergy started 08/04/2007   Otitis media 03/19/2005    History reviewed. No pertinent surgical history.  Social History   Socioeconomic History   Marital status: Single    Spouse name: Not on file   Number of children: Not on file   Years of education: Not on file   Highest education level: Not on file  Occupational History   Not on file  Tobacco Use   Smoking status: Never   Smokeless tobacco: Never  Substance and Sexual Activity   Alcohol use: No   Drug use: No   Sexual activity: Never    Birth control/protection: Implant  Other Topics Concern   Not on file  Social History Narrative   [employer]   7th grade   Basketball and cheerleading.   Social Determinants of Health   Financial Resource Strain: Not on file  Food Insecurity: Not on file  Transportation Needs: Not on file  Physical Activity: Not on file  Stress: Not on file  Social Connections: Not on file  Intimate Partner Violence: Not on file    Family History  Problem Relation Age of Onset   Asthma Brother    Hypertension Mother 91       medication.   Hypertension Maternal Aunt  Sickle cell anemia Maternal Uncle    Diabetes Maternal Grandmother    Hypertension Maternal Grandfather    Vision loss Maternal Grandfather        [place]   Hypertension Paternal Grandmother    Kidney disease Neg Hx    Stroke Neg Hx    Hyperlipidemia Neg Hx      Review of Systems  Constitutional: Negative.  Negative for chills and fever.  HENT: Negative.  Negative for congestion and sore throat.   Cardiovascular: Negative.  Negative for chest pain and palpitations.  Gastrointestinal: Negative.  Negative for abdominal pain, diarrhea, nausea and vomiting.  Genitourinary: Negative.         White itchy vaginal discharge  Skin: Negative.  Negative for rash.  Neurological: Negative.  Negative for dizziness and headaches.  All other systems reviewed and are negative.  Today's Vitals   04/07/21 1449  BP: 118/60  Pulse: 91  Temp: 98.6 F (37 C)  TempSrc: Oral  SpO2: 96%  Weight: 184 lb (83.5 kg)  Height: 5\' 1"  (1.549 m)   Body mass index is 34.77 kg/m.  Physical Exam Vitals reviewed. Exam conducted with a chaperone present.  Constitutional:      Appearance: Normal appearance.  HENT:     Head: Normocephalic.     Right Ear: Tympanic membrane, ear canal and external ear normal.     Left Ear: Tympanic membrane, ear canal and external ear normal.     Mouth/Throat:     Mouth: Mucous membranes are moist.     Pharynx: Oropharynx is clear.  Eyes:     Extraocular Movements: Extraocular movements intact.     Conjunctiva/sclera: Conjunctivae normal.     Pupils: Pupils are equal, round, and reactive to light.  Cardiovascular:     Rate and Rhythm: Normal rate and regular rhythm.     Pulses: Normal pulses.     Heart sounds: Normal heart sounds.  Pulmonary:     Effort: Pulmonary effort is normal.     Breath sounds: Normal breath sounds.  Abdominal:     General: There is no distension.     Palpations: Abdomen is soft. There is no mass.     Tenderness: There is no abdominal tenderness.  Genitourinary:    Rectum: Anal fissure present.  Musculoskeletal:     Cervical back: Normal range of motion and neck supple.  Skin:    General: Skin is warm and dry.     Capillary Refill: Capillary refill takes less than 2 seconds.  Neurological:     General: No focal deficit present.     Mental Status: She is alert and oriented to person, place, and time.  Psychiatric:        Mood and Affect: Mood normal.        Behavior: Behavior normal.      ASSESSMENT & PLAN: Problem List Items Addressed This Visit   None Visit Diagnoses     Routine general medical examination at a health  care facility    -  Primary   Need for hepatitis C screening test       Relevant Orders   Hepatitis C antibody screen   Screening for deficiency anemia       Relevant Orders   CBC with Differential   Screening for lipoid disorders       Relevant Orders   Lipid panel   Screening for endocrine, metabolic and immunity disorder       Relevant Orders   Comprehensive  metabolic panel   Hemoglobin A1c   Yeast vaginitis       Relevant Medications   fluconazole (DIFLUCAN) 150 MG tablet   Anal fissure          Modifiable risk factors discussed with patient. Anticipatory guidance according to age provided. The following topics were also discussed: Social Determinants of Health Smoking.  Non-smoker Diet and nutrition and need to decrease amount of daily carbohydrate intake Benefits of exercise Cancer family history review Vaccinations recommendations including need for HPV vaccine Cardiovascular risk assessment Diagnosis and treatment of anal fissure Diagnosis and treatment of yeast vaginitis Mental health including depression and anxiety Fall and accident prevention  Patient Instructions  Health Maintenance, Female Adopting a healthy lifestyle and getting preventive care are important in promoting health and wellness. Ask your health care provider about: The right schedule for you to have regular tests and exams. Things you can do on your own to prevent diseases and keep yourself healthy. What should I know about diet, weight, and exercise? Eat a healthy diet  Eat a diet that includes plenty of vegetables, fruits, low-fat dairy products, and lean protein. Do not eat a lot of foods that are high in solid fats, added sugars, or sodium. Maintain a healthy weight Body mass index (BMI) is used to identify weight problems. It estimates body fat based on height and weight. Your health care provider can help determine your BMI and help you achieve or maintain a healthy weight. Get regular  exercise Get regular exercise. This is one of the most important things you can do for your health. Most adults should: Exercise for at least 150 minutes each week. The exercise should increase your heart rate and make you sweat (moderate-intensity exercise). Do strengthening exercises at least twice a week. This is in addition to the moderate-intensity exercise. Spend less time sitting. Even light physical activity can be beneficial. Watch cholesterol and blood lipids Have your blood tested for lipids and cholesterol at 21 years of age, then have this test every 5 years. Have your cholesterol levels checked more often if: Your lipid or cholesterol levels are high. You are older than 21 years of age. You are at high risk for heart disease. What should I know about cancer screening? Depending on your health history and family history, you may need to have cancer screening at various ages. This may include screening for: Breast cancer. Cervical cancer. Colorectal cancer. Skin cancer. Lung cancer. What should I know about heart disease, diabetes, and high blood pressure? Blood pressure and heart disease High blood pressure causes heart disease and increases the risk of stroke. This is more likely to develop in people who have high blood pressure readings or are overweight. Have your blood pressure checked: Every 3-5 years if you are 65-88 years of age. Every year if you are 10 years old or older. Diabetes Have regular diabetes screenings. This checks your fasting blood sugar level. Have the screening done: Once every three years after age 1 if you are at a normal weight and have a low risk for diabetes. More often and at a younger age if you are overweight or have a high risk for diabetes. What should I know about preventing infection? Hepatitis B If you have a higher risk for hepatitis B, you should be screened for this virus. Talk with your health care provider to find out if you are at  risk for hepatitis B infection. Hepatitis C Testing is recommended for: Everyone born  from [place] through 1965. Anyone with known risk factors for hepatitis C. Sexually transmitted infections (STIs) Get screened for STIs, including gonorrhea and chlamydia, if: You are sexually active and are younger than 21 years of age. You are older than 21 years of age and your health care provider tells you that you are at risk for this type of infection. Your sexual activity has changed since you were last screened, and you are at increased risk for chlamydia or gonorrhea. Ask your health care provider if you are at risk. Ask your health care provider about whether you are at high risk for HIV. Your health care provider may recommend a prescription medicine to help prevent HIV infection. If you choose to take medicine to prevent HIV, you should first get tested for HIV. You should then be tested every 3 months for as long as you are taking the medicine. Pregnancy If you are about to stop having your period (premenopausal) and you may become pregnant, seek counseling before you get pregnant. Take 400 to 800 micrograms (mcg) of folic acid every day if you become pregnant. Ask for birth control (contraception) if you want to prevent pregnancy. Osteoporosis and menopause Osteoporosis is a disease in which the bones lose minerals and strength with aging. This can result in bone fractures. If you are 55 years old or older, or if you are at risk for osteoporosis and fractures, ask your health care provider if you should: Be screened for bone loss. Take a calcium or vitamin D supplement to lower your risk of fractures. Be given hormone replacement therapy (HRT) to treat symptoms of menopause. Follow these instructions at home: Alcohol use Do not drink alcohol if: Your health care provider tells you not to drink. You are pregnant, may be pregnant, or are planning to become pregnant. If you drink alcohol: Limit  how much you have to: 0-1 drink a day. Know how much alcohol is in your drink. In the [place] one drink equals one 12 oz bottle of beer (355 mL), one 5 oz glass of wine (148 mL), or one 1 oz glass of hard liquor (44 mL). Lifestyle Do not use any products that contain nicotine or tobacco. These products include cigarettes, chewing tobacco, and vaping devices, such as e-cigarettes. If you need help quitting, ask your health care provider. Do not use street drugs. Do not share needles. Ask your health care provider for help if you need support or information about quitting drugs. General instructions Schedule regular health, dental, and eye exams. Stay current with your vaccines. Tell your health care provider if: You often feel depressed. You have ever been abused or do not feel safe at home. Summary Adopting a healthy lifestyle and getting preventive care are important in promoting health and wellness. Follow your health care provider's instructions about healthy diet, exercising, and getting tested or screened for diseases. Follow your health care provider's instructions on monitoring your cholesterol and blood pressure. This information is not intended to replace advice given to you by your health care provider. Make sure you discuss any questions you have with your health care provider. Document Revised: 08/25/2020 Document Reviewed: 08/25/2020 Elsevier Patient Education  2022 [place], MD [facility]

## 2021-04-08 ENCOUNTER — Encounter: Payer: Self-pay | Admitting: Emergency Medicine

## 2021-04-08 LAB — HEPATITIS C ANTIBODY
Hepatitis C Ab: NONREACTIVE
SIGNAL TO CUT-OFF: 0.09 (ref <1.00)

## 2021-05-09 ENCOUNTER — Encounter: Payer: Self-pay | Admitting: Emergency Medicine

## 2021-05-13 ENCOUNTER — Encounter: Payer: Self-pay | Admitting: Nurse Practitioner

## 2021-05-15 ENCOUNTER — Ambulatory Visit: Payer: 59 | Admitting: Internal Medicine

## 2021-05-29 ENCOUNTER — Ambulatory Visit: Payer: 59 | Admitting: Nurse Practitioner

## 2021-06-04 ENCOUNTER — Other Ambulatory Visit (INDEPENDENT_AMBULATORY_CARE_PROVIDER_SITE_OTHER): Payer: 59

## 2021-06-04 ENCOUNTER — Ambulatory Visit: Payer: 59 | Admitting: Physician Assistant

## 2021-06-04 ENCOUNTER — Encounter: Payer: Self-pay | Admitting: Physician Assistant

## 2021-06-04 VITALS — BP 112/80 | HR 82 | Resp 18 | Ht 61.5 in | Wt 188.0 lb

## 2021-06-04 DIAGNOSIS — K629 Disease of anus and rectum, unspecified: Secondary | ICD-10-CM

## 2021-06-04 DIAGNOSIS — D649 Anemia, unspecified: Secondary | ICD-10-CM | POA: Diagnosis not present

## 2021-06-04 DIAGNOSIS — E538 Deficiency of other specified B group vitamins: Secondary | ICD-10-CM

## 2021-06-04 DIAGNOSIS — K625 Hemorrhage of anus and rectum: Secondary | ICD-10-CM

## 2021-06-04 LAB — COMPREHENSIVE METABOLIC PANEL
ALT: 11 U/L (ref 0–35)
AST: 14 U/L (ref 0–37)
Albumin: 4.5 g/dL (ref 3.5–5.2)
Alkaline Phosphatase: 52 U/L (ref 39–117)
BUN: 9 mg/dL (ref 6–23)
CO2: 30 mEq/L (ref 19–32)
Calcium: 9.9 mg/dL (ref 8.4–10.5)
Chloride: 104 mEq/L (ref 96–112)
Creatinine, Ser: 0.69 mg/dL (ref 0.40–1.20)
GFR: 123.85 mL/min (ref >60.00)
Glucose, Bld: 89 mg/dL (ref 70–99)
Potassium: 3.6 mEq/L (ref 3.5–5.1)
Sodium: 139 mEq/L (ref 135–145)
Total Bilirubin: 0.6 mg/dL (ref 0.2–1.2)
Total Protein: 7.7 g/dL (ref 6.0–8.3)

## 2021-06-04 LAB — C-REACTIVE PROTEIN: CRP: 1.4 mg/dL (ref 0.5–20.0)

## 2021-06-04 LAB — SEDIMENTATION RATE: Sed Rate: 8 mm/hr (ref 0–20)

## 2021-06-04 LAB — VITAMIN B12: Vitamin B-12: 364 pg/mL (ref 211–911)

## 2021-06-04 MED ORDER — HYDROCORTISONE ACETATE 30 MG RE SUPP: 30.0000 mg | Freq: Every day | RECTAL | 1 refills | Status: DC

## 2021-06-04 NOTE — Progress Notes (Signed)
Attending Physician's Attestation   I have reviewed the chart.   I agree with the Advanced Practitioner's note, impression, and recommendations with any updates as below. Endoscopic evaluation is reasonable.  Full colonoscopy to ensure no other lesions would be most ideal however, based on her age, a flexible sigmoidoscopy is not unreasonable.  Patient can be offered and perform either of those but if full colonoscopy is considered by patient, then will need a bowel preparation rather than just enema preparation.   Justice Britain, MD [facility] Advanced Endoscopy Office # [phone]

## 2021-06-04 NOTE — Progress Notes (Addendum)
06/04/2021 Lynn Roth [id number] 2000/10/18   ASSESSMENT AND PLAN:   Normocytic anemia Has very menstruation, has not had menses since July when last CBC drawn however now she has had 3 week heavy menses, will check iron, b12 -     CBC With Differential; Future -     Comprehensive metabolic panel; Future -     Iron and TIBC; Future -      B12; future  Rectal bleeding Possible internal hemorrhoids, will treat with hydrocortisone Will check for inflammation with possible ulcer and will schedule for flex sig, could consider complete colonoscopy however no AB pain, weight loss, change in bowel habits, will ultimately discuss with Dr. Rush Landmark which he would prefer.  Discussed with patient and she would prefer flex sigmoidoscopy, will continue as planned.  -     C-reactive protein; Future -     Sedimentation rate; Future -     Calprotectin, Fecal; Future -     HYDROCORTISONE ACE, RECTAL, 30 MG SUPP; Place 1 suppository (30 mg total) rectally at bedtime.  Rectal abnormality 2-3 mm ulceration 2 cm posterior rectum, no tenderness, induration, abscess, or tunneling noted. Does not appear to be fistula, no pus expressed, no fever, chills however if patient continues to have leukocytosis will schedule for imaging of AB pelvis to evaluate further.  If all is negative, can consider viral culture.  -     C-reactive protein; Future -     Sedimentation rate; Future -     Calprotectin, Fecal; Future -     HYDROCORTISONE ACE, RECTAL, 30 MG SUPP; Place 1 suppository (30 mg total) rectally at bedtime.   No future appointments.  Patient Care Team: Horald Pollen, MD as PCP - General (Internal Medicine)  HISTORY OF PRESENT ILLNESS: 21 y.o. female referred by Horald Pollen, *, with a past medical history of allergic rhinitis and others listed below presents for evaluation of rectal bleeding.  Patient had complete physical 04/07/2021 with primary care, had fissure present at  that time. CBC showed white blood cell count 11.7, hemoglobin 11.9, MCV 86.2 Normal kidney, normal liver get a hepatitis C  She has had rectal bleeding for a year, primarily with BM, now consistent blood with any BM and has had some episodes of rectal bleeding with just urinating.  BRB on TP with wiping rectum, has had some clots in it, small volume TP and some in toilet.  Has BM daily, well formed without straining.  Rare rectal itching, no burning or pain.   Patient denies GERD, dysphagia, nausea, vomiting, melena.  Patient denies constipation, diarrhea, AB pain. Denies changes in appetite, unintentional weight loss.  No family history of GI malignancy or IBD/autoimmune.   She was back on BCP in July, has not had period until this past month and has had for 3 weeks. Normally heavy menses.   Current Medications:     Current Outpatient Medications (Respiratory):    Azelastine HCl 0.15 % SOLN, Place 2 sprays into both nostrils 2 (two) times daily.   levocetirizine (XYZAL) 5 MG tablet, Take 1 tablet (5 mg total) by mouth every evening.    Current Outpatient Medications (Other):    HYDROCORTISONE ACE, RECTAL, 30 MG SUPP, Place 1 suppository (30 mg total) rectally at bedtime.   Medical History:  Past Medical History:  Diagnosis Date   Allergy started 08/04/2007   Otitis media 03/19/2005   Allergies: No Known Allergies   Surgical History:  She  has a past surgical history that includes No past surgeries. Family History:  Her family history includes Asthma in her brother; Diabetes in her maternal grandmother; Hypertension in her maternal aunt, maternal grandfather, and paternal grandmother; Hypertension (age of onset: 60) in her mother; Sickle cell anemia in her maternal uncle; Vision loss in her maternal grandfather. Social History:   reports that she has never smoked. She has never used smokeless tobacco. She reports that she does not drink alcohol and does not use  drugs.  REVIEW OF SYSTEMS  : All other systems reviewed and negative except where noted in the History of Present Illness.   PHYSICAL EXAM: BP 112/80    Pulse 82    Resp 18    Ht 5' 1.5" (1.562 m)    Wt 188 lb (85.3 kg)    BMI 34.95 kg/m  General:   Pleasant, well developed female in no acute distress Head:  Normocephalic and atraumatic. Eyes: sclerae anicteric,conjunctive pink  Heart:  regular rate and rhythm Pulm: Clear anteriorly; no wheezing Abdomen:  Soft, Obese AB, skin exam normal, Normal bowel sounds. no tenderness . Without guarding and Without rebound, without hepatomegaly. Rectal:  2-3 mm ulceration 2 cm posterior rectum, no tenderness, induration, abscess, or tunneling noted. Very small amount of white material expressed when squeezed. normal rectal tone, possible internal hemorrhoids appreciated, no masses, + tender, brown stool, hemoccult positive.  Extremities:  Without edema. Msk:  Symmetrical without gross deformities. Peripheral pulses intact.  Neurologic:  Alert and  oriented x4;  grossly normal neurologically. Skin:   Dry and intact without significant lesions or rashes. Psychiatric: Demonstrates good judgement and reason without abnormal affect or behaviors.   Vladimir Crofts, PA-C 11:40 AM

## 2021-06-04 NOTE — Patient Instructions (Addendum)
If you are age 22 or younger, your body mass index should be between 19-25. Your Body mass index is 34.95 kg/m. If this is out of the aformentioned range listed, please consider follow up with your Primary Care Provider.  ______________________________________________________  The [facility] providers would like to encourage you to use [facility] to communicate with providers for non-urgent requests or questions.  Due to long hold times on the telephone, sending your provider a message by [facility] may be a faster and more efficient way to get a response.  Please allow 48 business hours for a response.  Please remember that this is for non-urgent requests.  _______________________________________________________  Your provider has requested that you go to the basement level for lab work before leaving today. Press "B" on the elevator. The lab is located at the first door on the left as you exit the elevator.  You have been scheduled for a flexible sigmoidoscopy. Please follow the written instructions given to you at your visit today. If you use inhalers (even only as needed), please bring them with you on the day of your procedure.  Due to recent changes in healthcare laws, you may see the results of your imaging and laboratory studies on MyChart before your provider has had a chance to review them.  We understand that in some cases there may be results that are confusing or concerning to you. Not all laboratory results come back in the same time frame and the provider may be waiting for multiple results in order to interpret others.  Please give [place] 48 hours in order for your provider to thoroughly review all the results before contacting the office for clarification of your results.   We have sent the following medications to your pharmacy for you to pick up at your convenience: You use hydrocortisone suppositories once at night for 7-12 nights.   Please do sitz baths, increase fiber or add benefiber,  increase water and increase acitivity.  Will send in hydrocoritsone suppository, cheapest with [employer] from [employer], [employer], [employer] or [employer] if your insurance does not pay for it.  About Hemorrhoids  Hemorrhoids are swollen veins in the lower rectum and anus.  Also called piles, hemorrhoids are a common problem.  Hemorrhoids may be internal (inside the rectum) or external (around the anus).  Internal Hemorrhoids  Internal hemorrhoids are often painless, but they rarely cause bleeding.  The internal veins may stretch and fall down (prolapse) through the anus to the outside of the body.  The veins may then become irritated and painful.  External Hemorrhoids  External hemorrhoids can be easily seen or felt around the anal opening.  They are under the skin around the anus.  When the swollen veins are scratched or broken by straining, rubbing or wiping they sometimes bleed.  How Hemorrhoids Occur  Veins in the rectum and around the anus tend to swell under pressure.  Hemorrhoids can result from increased pressure in the veins of your anus or rectum.  Some sources of pressure are:  Straining to have a bowel movement because of constipation Waiting too long to have a bowel movement Coughing and sneezing often Sitting for extended periods of time, including on the toilet Diarrhea Obesity Trauma or injury to the anus Some liver diseases Stress Family history of hemorrhoids Pregnancy  Pregnant women should try to avoid becoming constipated, because they are more likely to have hemorrhoids during pregnancy.  In the last trimester of pregnancy, the enlarged uterus may press  on blood vessels and causes hemorrhoids.  In addition, the strain of childbirth sometimes causes hemorrhoids after the birth.  Symptoms of Hemorrhoids  Some symptoms of hemorrhoids include: Swelling and/or a tender lump around the anus Itching, mild burning and bleeding around the anus Painful bowel movements with  or without constipation Bright red blood covering the stool, on toilet paper or in the toilet bowel.   Symptoms usually go away within a few days.  Always talk to your doctor about any bleeding to make sure it is not from some other causes.  Diagnosing and Treating Hemorrhoids  Diagnosis is made by an examination by your healthcare provider.  Special test can be performed by your doctor.    Most cases of hemorrhoids can be treated with: High-fiber diet: Eat more high-fiber foods, which help prevent constipation.  Ask for more detailed fiber information on types and sources of fiber from your healthcare provider. Fluids: Drink plenty of water.  This helps soften bowel movements so they are easier to pass. Sitz baths and cold packs: Sitting in lukewarm water two or three times a day for 15 minutes cleases the anal area and may relieve discomfort.  If the water is too hot, swelling around the anus will get worse.  Placing a cloth-covered ice pack on the anus for ten minutes four times a day can also help reduce selling.  Gently pushing a prolapsed hemorrhoid back inside after the bath or ice pack can be helpful. Medications: For mild discomfort, your healthcare provider may suggest over-the-counter pain medication or prescribe a cream or ointment for topical use.  The cream may contain witch hazel, zinc oxide or petroleum jelly.  Medicated suppositories are also a treatment option.  Always consult your doctor before applying medications or creams. Procedures and surgeries: There are also a number of procedures and surgeries to shrink or remove hemorrhoids in more serious cases.  Talk to your physician about these options.  You can often prevent hemorrhoids or keep them from becoming worse by maintaining a healthy lifestyle.  Eat a fiber-rich diet of fruits, vegetables and whole grains.  Also, drink plenty of water and exercise regularly.   2007, [employer] Doc[employer]17  Thank you for  entrusting me with your care and choosing [facility].  Vicie Mutters, PA-C

## 2021-06-05 ENCOUNTER — Telehealth: Payer: Self-pay | Admitting: Physician Assistant

## 2021-06-05 LAB — CBC WITH DIFFERENTIAL
Basophils Absolute: 0.1 10*3/uL (ref 0.0–0.2)
Basos: 2 %
EOS (ABSOLUTE): 0.2 10*3/uL (ref 0.0–0.4)
Eos: 2 %
Hematocrit: 36.8 % (ref 34.0–46.6)
Hemoglobin: 12.2 g/dL (ref 11.1–15.9)
Immature Grans (Abs): 0 10*3/uL (ref 0.0–0.1)
Immature Granulocytes: 0 %
Lymphocytes Absolute: 2.1 10*3/uL (ref 0.7–3.1)
Lymphs: 29 %
MCH: 29.2 pg (ref 26.6–33.0)
MCHC: 33.2 g/dL (ref 31.5–35.7)
MCV: 88 fL (ref 79–97)
Monocytes Absolute: 0.4 10*3/uL (ref 0.1–0.9)
Monocytes: 6 %
Neutrophils Absolute: 4.5 10*3/uL (ref 1.4–7.0)
Neutrophils: 61 %
RBC: 4.18 x10E6/uL (ref 3.77–5.28)
RDW: 12.4 % (ref 11.7–15.4)
WBC: 7.4 10*3/uL (ref 3.4–10.8)

## 2021-06-05 LAB — IRON AND TIBC
Iron Saturation: 35 % (ref 15–55)
Iron: 102 ug/dL (ref 27–159)
Total Iron Binding Capacity: 290 ug/dL (ref 250–450)
UIBC: 188 ug/dL (ref 131–425)

## 2021-06-05 NOTE — Telephone Encounter (Signed)
Inbound call from requesting to speak to someone please in regards to scheduled procedure she has for March.

## 2021-06-05 NOTE — Telephone Encounter (Signed)
Patient advised of Dr Donneta Romberg recommendations. "Full colonoscopy to ensure no other lesions would be most ideal however, based on her age, a flexible sigmoidoscopy is not unreasonable.  Patient can be offered and perform either of those but if full colonoscopy is considered by patient, then will need a bowel preparation rather than just enema preparation."  Patient was offered a colonoscopy, but stated she prefers to proceed with flexible sigmoidoscopy due to her work schedule.  Patient agreed to plan and verbalized understanding.  No further questions.

## 2021-06-08 ENCOUNTER — Other Ambulatory Visit: Payer: 59

## 2021-06-08 DIAGNOSIS — K629 Disease of anus and rectum, unspecified: Secondary | ICD-10-CM

## 2021-06-08 DIAGNOSIS — K625 Hemorrhage of anus and rectum: Secondary | ICD-10-CM

## 2021-06-13 LAB — CALPROTECTIN: Calprotectin: 38 mcg/g

## 2021-06-30 ENCOUNTER — Other Ambulatory Visit: Payer: Self-pay

## 2021-06-30 ENCOUNTER — Ambulatory Visit (AMBULATORY_SURGERY_CENTER): Payer: 59 | Admitting: Gastroenterology

## 2021-06-30 ENCOUNTER — Telehealth: Payer: 59 | Admitting: Physician Assistant

## 2021-06-30 ENCOUNTER — Encounter: Payer: Self-pay | Admitting: Gastroenterology

## 2021-06-30 VITALS — BP 122/85 | HR 81 | Temp 97.7°F | Resp 13 | Ht 61.0 in | Wt 188.0 lb

## 2021-06-30 DIAGNOSIS — K920 Hematemesis: Secondary | ICD-10-CM

## 2021-06-30 DIAGNOSIS — K644 Residual hemorrhoidal skin tags: Secondary | ICD-10-CM | POA: Diagnosis not present

## 2021-06-30 DIAGNOSIS — K625 Hemorrhage of anus and rectum: Secondary | ICD-10-CM

## 2021-06-30 DIAGNOSIS — J029 Acute pharyngitis, unspecified: Secondary | ICD-10-CM

## 2021-06-30 DIAGNOSIS — K621 Rectal polyp: Secondary | ICD-10-CM | POA: Diagnosis not present

## 2021-06-30 MED ORDER — HYDROCORTISONE ACETATE 25 MG RE SUPP: 25.0000 mg | Freq: Every day | RECTAL | 1 refills | Status: DC

## 2021-06-30 MED ORDER — SODIUM CHLORIDE 0.9 % IV SOLN: 500.0000 mL | Freq: Once | INTRAVENOUS | Status: DC

## 2021-06-30 MED ORDER — AMOXICILLIN 500 MG PO TABS: 500.0000 mg | ORAL_TABLET | Freq: Two times a day (BID) | ORAL | 0 refills | Status: AC

## 2021-06-30 NOTE — Progress Notes (Signed)
VS  DT ? ?Pt's states no medical or surgical changes since previsit or office visit. ? ?

## 2021-06-30 NOTE — Op Note (Signed)
[facility] ?Patient Name: Lynn Roth ?Procedure Date: 06/30/2021 2:35 PM ?MRN: [id number] ?Endoscopist: Justice Britain , MD ?Age: 21 ?Referring MD:  ?Date of Birth: March 30, 2001 ?Gender: Female ?Account #: [id number] ?Procedure:                Flexible Sigmoidoscopy ?Indications:              Rectal hemorrhage, Hematochezia ?Medicines:                Monitored Anesthesia Care ?Procedure:                Pre-Anesthesia Assessment: ?                          - Prior to the procedure, a History and Physical  ?                          was performed, and patient medications and  ?                          allergies were reviewed. The patient's tolerance of  ?                          previous anesthesia was also reviewed. The risks  ?                          and benefits of the procedure and the sedation  ?                          options and risks were discussed with the patient.  ?                          All questions were answered, and informed consent  ?                          was obtained. Prior Anticoagulants: The patient has  ?                          taken no previous anticoagulant or antiplatelet  ?                          agents. ASA Grade Assessment: II - A patient with  ?                          mild systemic disease. After reviewing the risks  ?                          and benefits, the patient was deemed in  ?                          satisfactory condition to undergo the procedure. ?                          After obtaining informed consent, the scope was  ?  passed under direct vision. The Olympus CF-HQ190L  ?                          [id number]) Colonoscope was introduced through the  ?                          anus and advanced to the 60 cm from the anal verge  ?                          which led to the cecum. The flexible sigmoidoscopy  ?                          was accomplished without difficulty. The patient  ?                          tolerated the  procedure. The quality of the bowel  ?                          preparation was adequate. ?Scope In: ?Scope Out: ?Findings:                 The perianal exam findings include a small perianal  ?                          excoriation. ?                          Skin tag was found on perianal exam. ?                          The digital rectal exam findings include  ?                          hemorrhoids. Pertinent negatives include no  ?                          palpable rectal lesions. ?                          Normal mucosa was found in the entire visualized  ?                          colon. Biopsies were taken with a cold forceps for  ?                          histology from the rectum to rule out chronic  ?                          proctitis. ?                          Non-bleeding non-thrombosed external and internal  ?                          hemorrhoids were found during retroflexion, during  ?  perianal exam and during digital exam. The  ?                          hemorrhoids were Grade II (internal hemorrhoids  ?                          that prolapse but reduce spontaneously). ?Complications:            No immediate complications. ?Estimated Blood Loss:     Estimated blood loss was minimal. ?Impression:               - Perianal excoriation noted on perianal exam. ?                          - Perianal skin tag found on perianal exam. ?                          - Hemorrhoids found on digital rectal exam. ?                          - Normal mucosa in the entire visualized colon.  ?                          Biopsied rectum to rule out chronic proctitis. ?                          - Non-bleeding non-thrombosed external and internal  ?                          hemorrhoids. ?Recommendation:           - The patient will be observed post-procedure,  ?                          until all discharge criteria are met. ?                          - Discharge patient to home. ?                           - Patient has a contact number available for  ?                          emergencies. The signs and symptoms of potential  ?                          delayed complications were discussed with the  ?                          patient. Return to normal activities tomorrow.  ?                          Written discharge instructions were provided to the  ?                          patient. ?                          -  High fiber diet. ?                          - Use FiberCon 1-2 tablets PO daily. ?                          - Await pathology results. ?                          - Will discuss case with my colleagues to see if  ?                          Anoscopy with possible internal hemorrhoidal  ?                          banding vs referral to CRS to discuss other  ?                          hemorrhoidal options may be required. ?                          - Screening colonoscopy at age 26 (based on current  ?                          guidelines). ?                          - The findings and recommendations were discussed  ?                          with the patient. ?                          - The findings and recommendations were discussed  ?                          with the patient's family. ?Justice Britain, MD ?06/30/2021 3:00:00 PM ?

## 2021-06-30 NOTE — Progress Notes (Signed)

## 2021-06-30 NOTE — Progress Notes (Signed)
I have spent 5 minutes in review of e-visit questionnaire, review and updating patient chart, medical decision making and response to patient.   Ilyana Manuele Cody Zaydyn Havey, PA-C    

## 2021-06-30 NOTE — Progress Notes (Signed)
A and O x3. Report to RN. Tolerated MAC anesthesia well. 

## 2021-06-30 NOTE — Patient Instructions (Signed)
Recommend a high-fiber diet (see handout). ?Use FiberCon 1-2 tablets by mouth daily.  ? ?Handout provided on hemorrhoids and hemorrhoid banding.  ? ?YOU HAD AN ENDOSCOPIC PROCEDURE TODAY AT [facility]:   Refer to the procedure report that was given to you for any specific questions about what was found during the examination.  If the procedure report does not answer your questions, please call your gastroenterologist to clarify.  If you requested that your care partner not be given the details of your procedure findings, then the procedure report has been included in a sealed envelope for you to review at your convenience later. ? ?YOU SHOULD EXPECT: Some feelings of bloating in the abdomen. Passage of more gas than usual.  Walking can help get rid of the air that was put into your GI tract during the procedure and reduce the bloating. If you had a lower endoscopy (such as a colonoscopy or flexible sigmoidoscopy) you may notice spotting of blood in your stool or on the toilet paper. If you underwent a bowel prep for your procedure, you may not have a normal bowel movement for a few days. ? ?Please Note:  You might notice some irritation and congestion in your nose or some drainage.  This is from the oxygen used during your procedure.  There is no need for concern and it should clear up in a day or so. ? ?SYMPTOMS TO REPORT IMMEDIATELY: ? ?Following lower endoscopy (colonoscopy or flexible sigmoidoscopy): ? Excessive amounts of blood in the stool ? Significant tenderness or worsening of abdominal pains ? Swelling of the abdomen that is new, acute ? Fever of 100?F or higher ? ?For urgent or emergent issues, a gastroenterologist can be reached at any hour by calling [phone]. ?Do not use MyChart messaging for urgent concerns.  ? ? ?DIET:  We do recommend a small meal at first, but then you may proceed to your regular diet.  Drink plenty of fluids but you should avoid alcoholic beverages for 24  hours. ? ?ACTIVITY:  You should plan to take it easy for the rest of today and you should NOT DRIVE or use heavy machinery until tomorrow (because of the sedation medicines used during the test).   ? ?FOLLOW UP: ?Our staff will call the number listed on your records 48-72 hours following your procedure to check on you and address any questions or concerns that you may have regarding the information given to you following your procedure. If we do not reach you, we will leave a message.  We will attempt to reach you two times.  During this call, we will ask if you have developed any symptoms of COVID 19. If you develop any symptoms (ie: fever, flu-like symptoms, shortness of breath, cough etc.) before then, please call [phone].  If you test positive for Covid 19 in the 2 weeks post procedure, please call and report this information to [place].   ? ?If any biopsies were taken you will be contacted by phone or by letter within the next 1-3 weeks.  Please call us at [phone] if you have not heard about the biopsies in 3 weeks.  ? ? ?SIGNATURES/CONFIDENTIALITY: ?You and/or your care partner have signed paperwork which will be entered into your electronic medical record.  These signatures attest to the fact that that the information above on your After Visit Summary has been reviewed and is understood.  Full responsibility of the confidentiality of this discharge information lies with  you and/or your care-partner. ? ?

## 2021-06-30 NOTE — Progress Notes (Signed)
? ?GASTROENTEROLOGY PROCEDURE H&P NOTE  ? ?Primary Care Physician: ?Horald Pollen, MD ? ?HPI: ?Lynn Roth is a 21 y.o. female who presents for flexible sigmoidoscopy for evaluation of rectal bleeding. ? ?Past Medical History:  ?Diagnosis Date  ? Allergy started 08/04/2007  ? Otitis media 03/19/2005  ? ?Past Surgical History:  ?Procedure Laterality Date  ? NO PAST SURGERIES    ? ?Current Outpatient Medications  ?Medication Sig Dispense Refill  ? amoxicillin (AMOXIL) 500 MG tablet Take 1 tablet (500 mg total) by mouth 2 (two) times daily for 10 days. 20 tablet 0  ? Azelastine HCl 0.15 % SOLN Place 2 sprays into both nostrils 2 (two) times daily. 30 mL 5  ? HYDROCORTISONE ACE, RECTAL, 30 MG SUPP Place 1 suppository (30 mg total) rectally at bedtime. 12 suppository 1  ? levocetirizine (XYZAL) 5 MG tablet Take 1 tablet (5 mg total) by mouth every evening. 30 tablet 11  ? ?No current facility-administered medications for this visit.  ? ? ?Current Outpatient Medications:  ?  amoxicillin (AMOXIL) 500 MG tablet, Take 1 tablet (500 mg total) by mouth 2 (two) times daily for 10 days., Disp: 20 tablet, Rfl: 0 ?  Azelastine HCl 0.15 % SOLN, Place 2 sprays into both nostrils 2 (two) times daily., Disp: 30 mL, Rfl: 5 ?  HYDROCORTISONE ACE, RECTAL, 30 MG SUPP, Place 1 suppository (30 mg total) rectally at bedtime., Disp: 12 suppository, Rfl: 1 ?  levocetirizine (XYZAL) 5 MG tablet, Take 1 tablet (5 mg total) by mouth every evening., Disp: 30 tablet, Rfl: 11 ?No Known Allergies ?Family History  ?Problem Relation Age of Onset  ? Hypertension Mother 6  ?     medication.  ? Asthma Brother   ? Diabetes Maternal Grandmother   ? Hypertension Maternal Grandfather   ? Vision loss Maternal Grandfather   ?     glacoma  ? Hypertension Paternal Grandmother   ? Hypertension Maternal Aunt   ? Sickle cell anemia Maternal Uncle   ? Kidney disease Neg Hx   ? Stroke Neg Hx   ? Hyperlipidemia Neg Hx   ? Stomach cancer Neg Hx   ? Colon  cancer Neg Hx   ? Esophageal cancer Neg Hx   ? Pancreatic cancer Neg Hx   ? ?Social History  ? ?Socioeconomic History  ? Marital status: Single  ?  Spouse name: Not on file  ? Number of children: Not on file  ? Years of education: Not on file  ? Highest education level: Not on file  ?Occupational History  ? Not on file  ?Tobacco Use  ? Smoking status: Never  ? Smokeless tobacco: Never  ?Vaping Use  ? Vaping Use: Never used  ?Substance and Sexual Activity  ? Alcohol use: No  ? Drug use: No  ? Sexual activity: Never  ?  Birth control/protection: Implant  ?Other Topics Concern  ? Not on file  ?Social History Narrative  ? [employer]  ? 7th grade  ? Basketball and cheerleading.  ? ?Social Determinants of Health  ? ?Financial Resource Strain: Not on file  ?Food Insecurity: Not on file  ?Transportation Needs: Not on file  ?Physical Activity: Not on file  ?Stress: Not on file  ?Social Connections: Not on file  ?Intimate Partner Violence: Not on file  ? ? ?Physical Exam: ?There were no vitals filed for this visit. ?There is no height or weight on file to calculate BMI. ?GEN: NAD ?EYE: Sclerae anicteric ?  ENT: MMM ?CV: Non-tachycardic ?GI: Soft, NT/ND ?NEURO:  Alert & Oriented x 3 ? ?Lab Results: ?No results for input(s): WBC, HGB, HCT, PLT in the last 72 hours. ?BMET ?No results for input(s): NA, K, CL, CO2, GLUCOSE, BUN, CREATININE, CALCIUM in the last 72 hours. ?LFT ?No results for input(s): PROT, ALBUMIN, AST, ALT, ALKPHOS, BILITOT, BILIDIR, IBILI in the last 72 hours. ?PT/INR ?No results for input(s): LABPROT, INR in the last 72 hours. ? ? ?Impression / Plan: ?This is a 21 y.o.female who presents for flexible sigmoidoscopy for evaluation of rectal bleeding. ? ?The risks and benefits of endoscopic evaluation/treatment were discussed with the patient and/or family; these include but are not limited to the risk of perforation, infection, bleeding, missed lesions, lack of diagnosis, severe illness requiring  hospitalization, as well as anesthesia and sedation related illnesses.  The patient's history has been reviewed, patient examined, no change in status, and deemed stable for procedure.  The patient and/or family is agreeable to proceed.  ? ? ?Justice Britain, MD ?[facility] ?Advanced Endoscopy ?Office # [phone] ? ?

## 2021-07-02 ENCOUNTER — Telehealth: Payer: Self-pay

## 2021-07-02 NOTE — Telephone Encounter (Signed)
?  Follow up Call- ? ?Call back number 06/30/2021  ?Post procedure Call Back phone  # [phone]  ?Permission to leave phone message Yes  ?Some recent data might be hidden  ?  ? ?Left message ?

## 2021-07-02 NOTE — Telephone Encounter (Signed)
Second post procedure follow up call, no answer 

## 2021-07-07 ENCOUNTER — Encounter: Payer: Self-pay | Admitting: Gastroenterology

## 2021-07-09 ENCOUNTER — Telehealth: Payer: Self-pay

## 2021-07-09 NOTE — Telephone Encounter (Signed)
-----   Message from Irving Copas., MD sent at 07/08/2021  4:58 PM EDT ----- ?Olena Leatherwood, ?Thanks for the reply. ?The may have the patient come back and talk about where things stand and then I will certainly let you know and put a referral if still needed. ? ?Tanysha Quant, please set up a follow-up in clinic with me in the next 4-6 weeks to see how she is doing and discuss how she did with the most recent hemorrhoidal suppositories. ?If at that clinic visit she is still having issues, I will then likely refer her to Dr. Silverio Decamp to consider anoscopy and hemorrhoid banding. ?Thanks. ?GM ? ?----- Message ----- ?From: Mauri Pole, MD ?Sent: 07/08/2021   3:43 PM EDT ?To: Oda Kilts, CMA, # ? ?Sure, can consider hemorrhoidal band ligation if she is symptomatic and if patient is interested in doing it.  ?Shirlean Mylar can help schedule patient if patient is interested in proceeding ?----- Message ----- ?From: Mansouraty, Telford Nab., MD ?Sent: 07/07/2021   5:05 AM EDT ?To: Ladene Artist, MD, Mauri Pole, MD ? ?MS and KVN, ?This is a very interesting and nice 21 year old.  She has been experiencing episodes of rectal bleeding that [facility] sent for evaluation.  Performed her flex sig and she had no evidence of any lesions (actually all the way to the cecum) and had no evidence of chronic proctitis.  She has some external skin tags and what looks to be nice internal hemorrhoidal column.  She felt that the Anusol suppositories helped somewhat but not completely.  Would either of you consider offering this patient an endoscopy to evaluate whether she is a candidate for internal hemorrhoidal banding versus just sending her to colorectal surgery to take care of both issues? ?Thanks as always. ?GM ?----- Message ----- ?From: Interface, [facility] ?Sent: 07/03/2021   1:48 PM EDT ?To: Irving Copas., MD ? ? ? ? ?

## 2021-07-09 NOTE — Telephone Encounter (Signed)
The appt has been scheduled for an appt with Dr Rush Landmark on 08/25/21 at 3:10 pm.  I have sent the appt information to the pt via My Chart. I have verified that the pt reads her My Chart messages.   ?

## 2021-08-25 ENCOUNTER — Ambulatory Visit: Payer: 59 | Admitting: Gastroenterology

## 2021-08-25 ENCOUNTER — Encounter: Payer: Self-pay | Admitting: Gastroenterology

## 2021-08-25 VITALS — BP 126/68 | HR 88 | Ht 61.0 in | Wt 186.1 lb

## 2021-08-25 DIAGNOSIS — K625 Hemorrhage of anus and rectum: Secondary | ICD-10-CM

## 2021-08-25 DIAGNOSIS — K649 Unspecified hemorrhoids: Secondary | ICD-10-CM | POA: Diagnosis not present

## 2021-08-25 MED ORDER — HYDROCORTISONE ACETATE 25 MG RE SUPP: 25.0000 mg | Freq: Two times a day (BID) | RECTAL | 1 refills | Status: DC

## 2021-08-25 NOTE — Patient Instructions (Addendum)
Toileting tips to help with your constipation ?- Drink at least 64-80 ounces of water/liquid per day. ?- Establish a time to try to move your bowels every day.  For many people, this is after a cup of coffee or after a meal such as breakfast. ?- Sit all of the way back on the toilet keeping your back fairly straight and while sitting up, try to rest the tops of your forearms on your upper thighs.   ?- Raising your feet with a step stool/squatty potty can be helpful to improve the angle that allows your stool to pass through the rectum. ?- Relax the rectum feeling it bulge toward the toilet water.  If you feel your rectum raising toward your body, you are contracting rather than relaxing. ?- Breathe in and slowly exhale. "Belly breath" by expanding your belly towards your belly button. Keep belly expanded as you gently direct pressure down and back to the anus.  A low pitched GRRR sound can assist with increasing intra-abdominal pressure.  ?- Repeat 3-4 times. If unsuccessful, contract the pelvic floor to restore normal tone and get off the toilet.  Avoid excessive straining. ?- To reduce excessive wiping by teaching your anus to normally contract, place hands on outer aspect of knees and resist knee movement outward.  Hold 5-10 second then place hands just inside of knees and resist inward movement of knees.  Hold 5 seconds.  Repeat a few times each way. ? ?We have sent the following medications to your pharmacy for you to pick up at your convenience: ? ?Anusol Suppositories - use nightly for 7 nights , then every other night until completion of prescription.   ? ?Start Fiber Con or Metamucil as directed.  ? ?A high fiber diet with plenty of fluids (up to 8 glasses of water daily) is suggested to relieve these symptoms.  Metamucil or Fiber Con , 1 tablespoon once or twice daily can be used to keep bowels regular if needed. ? ?Follow up with Dr Rush Landmark on 10/21/21 at 8:30am ? ?Thank you for choosing me and [facility]. ? ?Dr. Rush Landmark ? ? ?

## 2021-08-26 ENCOUNTER — Encounter: Payer: Self-pay | Admitting: Gastroenterology

## 2021-08-26 NOTE — Progress Notes (Signed)
? ?GASTROENTEROLOGY OUTPATIENT CLINIC VISIT  ? ?Primary Care Provider ?Horald Pollen, MD ?[address] ?[place] [address] ?[phone] ? ? ?Patient Profile: ?Lynn Roth is a 22 y.o. female with a pmh significant for sickle cell trait, allergies, hemorrhoids.  The patient presents to the [facility] for an evaluation and management of problem(s) noted below: ? ?Problem List ?1. Hemorrhoids, unspecified hemorrhoid type   ?2. Rectal bleeding   ? ? ?History of Present Illness ?Please see prior notes for full details of HPI. ? ?Interval History ?The patient returns for follow-up.  At the time of her colonoscopy we found that she only had hemorrhoidal disease as a cause for her bright red blood per rectum.  She was prescribed Anusol suppositories.  However, she was never able to pick them up as she states that there was some sort of issue with an insurance perspective or from the prescription.  We never heard about this.  She states she still is experiencing episodes of bright red blood per rectum, mostly around the time that she is having a bowel movement although she has noted blood on the toilet paper and in the toilet bowl even when she is just gone to urinate and does not believe that is coming from her urine or from her vagina.  She drinks at least 4-5 bottles of fluid per day.  She is not straining to have a bowel movement.  She is hopeful that there is some sort of treatment for her symptoms. ? ?GI Review of Systems ?Positive as above ?Negative for pyrosis, dysphagia, odynophagia, pain, nausea, vomiting, alteration of bowel habits ? ?Review of Systems ?General: Denies fevers/chills/weight loss unintentionally ?Cardiovascular: Denies chest pain ?Pulmonary: Denies shortness of breath ?Gastroenterological: See HPI ?Genitourinary: Denies darkened urine ?Hematological: Denies easy bruising/bleeding ?Dermatological: Denies jaundice ?Psychological: Mood is  stable ? ? ?Medications ?Current Outpatient Medications  ?Medication Sig Dispense Refill  ? Azelastine HCl 0.15 % SOLN Place 2 sprays into both nostrils 2 (two) times daily. 30 mL 5  ? hydrocortisone (ANUSOL-HC) 25 MG suppository Place 1 suppository (25 mg total) rectally every 12 (twelve) hours. 12 suppository 1  ? levocetirizine (XYZAL) 5 MG tablet Take 1 tablet (5 mg total) by mouth every evening. 30 tablet 11  ? ?No current facility-administered medications for this visit.  ? ? ?Allergies ?No Known Allergies ? ?Histories ?Past Medical History:  ?Diagnosis Date  ? Allergy started 08/04/2007  ? Otitis media 03/19/2005  ? Sickle cell trait ([place])   ? ?Past Surgical History:  ?Procedure Laterality Date  ? NO PAST SURGERIES    ? ?Social History  ? ?Socioeconomic History  ? Marital status: Single  ?  Spouse name: Not on file  ? Number of children: Not on file  ? Years of education: Not on file  ? Highest education level: Not on file  ?Occupational History  ? Not on file  ?Tobacco Use  ? Smoking status: Never  ? Smokeless tobacco: Never  ?Vaping Use  ? Vaping Use: Never used  ?Substance and Sexual Activity  ? Alcohol use: No  ? Drug use: No  ? Sexual activity: Never  ?  Birth control/protection: Implant  ?Other Topics Concern  ? Not on file  ?Social History Narrative  ? [employer]  ? 7th grade  ? Basketball and cheerleading.  ? ?Social Determinants of Health  ? ?Financial Resource Strain: Not on file  ?Food Insecurity: Not on file  ?Transportation Needs: Not on  file  ?Physical Activity: Not on file  ?Stress: Not on file  ?Social Connections: Not on file  ?Intimate Partner Violence: Not on file  ? ?Family History  ?Problem Relation Age of Onset  ? Hypertension Mother 72  ?     medication.  ? Asthma Brother   ? Hypertension Maternal Aunt   ? Sickle cell anemia Maternal Uncle   ? Diabetes Maternal Grandmother   ? Hypertension Maternal Grandfather   ? Vision loss Maternal Grandfather   ?     glacoma  ?  Hypertension Paternal Grandmother   ? Kidney disease Neg Hx   ? Stroke Neg Hx   ? Hyperlipidemia Neg Hx   ? Stomach cancer Neg Hx   ? Colon cancer Neg Hx   ? Esophageal cancer Neg Hx   ? Pancreatic cancer Neg Hx   ? Liver disease Neg Hx   ? Inflammatory bowel disease Neg Hx   ? Rectal cancer Neg Hx   ? ?I have reviewed her medical, social, and family history in detail and updated the electronic medical record as necessary.  ? ? ?PHYSICAL EXAMINATION  ?BP 126/68   Pulse 88   Ht '5\' 1"'$  (1.549 m)   Wt 186 lb 2 oz (84.4 kg)   SpO2 98%   BMI 35.17 kg/m?  ?Wt Readings from Last 3 Encounters:  ?08/25/21 186 lb 2 oz (84.4 kg)  ?06/30/21 188 lb (85.3 kg)  ?06/04/21 188 lb (85.3 kg)  ?GEN: NAD, appears stated age, doesn't appear chronically ill ?PSYCH: Cooperative, without pressured speech ?EYE: Conjunctivae pink, sclerae anicteric ?ENT: MMM ?CV: Nontachycardic ?RESP: No audible wheezing ?GI: NABS, soft, ND, without rebound ?MSK/EXT: No lower extremity edema ?SKIN: No jaundice ?NEURO:  Alert & Oriented x 3 ? ? ?REVIEW OF DATA  ?I reviewed the following data at the time of this encounter: ? ?GI Procedures and Studies  ?March 2023 flex sigmoidoscopy ?- Perianal excoriation noted on perianal exam. ?- Perianal skin tag found on perianal exam. ?- Hemorrhoids found on digital rectal exam. ?- Normal mucosa in the entire visualized colon. Biopsied rectum to rule out chronic proctitis. ?- Non-bleeding non-thrombosed external and internal hemorrhoids. ? ?Pathology ?Diagnosis ?Surgical [P], colon, rectum ?- BENIGN COLONIC MUCOSA ?- NO ACTIVE INFLAMMATION OR EVIDENCE OF MICROSCOPIC COLITIS ?- NO HIGH-GRADE DYSPLASIA OR MALIGNANCY IDENTIFIED ? ?Laboratory Studies  ?Reviewed those in epic ? ?Imaging Studies  ?No relevant studies to review ? ? ?ASSESSMENT  ?[name] is a 21 y.o. female with a pmh significant for sickle cell trait, allergies, hemorrhoids.  The patient is seen today for evaluation and management of: ? ?1. Hemorrhoids,  unspecified hemorrhoid type   ?2. Rectal bleeding   ? ?The patient is hemodynamically stable.  Clinically she continues to experience episodes of bright red blood per rectum as well as blood in the toilet that is not felt to be hematuria or vaginal bleeding.  She never initiated the Anusol suppositories in an effort of trying to see if that would be of benefit to the patient.  Her hemorrhoidal disease burden is not severe, though I have discussed this case with one of my partners and since no other etiology for her bleeding has been found and she has no evidence of proctitis on pathology, an attempt at anoscopy and hemorrhoidal banding internally is not unreasonable.  We will see how the patient does with an extended course of Anusol suppositories and then she will follow-up in clinic.  If she is still experiencing  issues, then I recommend consideration of hemorrhoidal banding with Dr. Silverio Decamp whom I had discussed this patient's case with previously.  She will continue fiber in her diet and try to initiate a fiber supplement on a daily basis.  Toileting techniques were discussed with the patient as well.  All patient questions were answered to the best of my ability, and the patient agrees to the aforementioned plan of action with follow-up as indicated. ? ? ?PLAN  ?Increase dietary fiber ?Initiate FiberCon or Benefiber or Metamucil daily ?Toileting techniques discussed ?Initiate Anusol suppositories ?- Nightly x1 week then every other night until 2 full prescriptions have been completed ?MyChart and update [place] to see how things are doing ?Follow-up in clinic in 6 weeks ?If patient still having issues then will consider hemorrhoidal banding ?- We will refer to Dr. Silverio Decamp whom I have discussed this case with previously if need be ? ? ?No orders of the defined types were placed in this encounter. ? ? ?New Prescriptions  ? HYDROCORTISONE (ANUSOL-HC) 25 MG SUPPOSITORY    Place 1 suppository (25 mg total) rectally  every 12 (twelve) hours.  ? ?Modified Medications  ? No medications on file  ? ? ?Planned Follow Up ?No follow-ups on file. ? ? ?Total Time in Face-to-Face and in Coordination of Care for patient including independent/personal interpretati

## 2021-09-30 ENCOUNTER — Telehealth: Payer: 59 | Admitting: Physician Assistant

## 2021-09-30 DIAGNOSIS — B9789 Other viral agents as the cause of diseases classified elsewhere: Secondary | ICD-10-CM

## 2021-09-30 DIAGNOSIS — J019 Acute sinusitis, unspecified: Secondary | ICD-10-CM | POA: Diagnosis not present

## 2021-09-30 MED ORDER — IPRATROPIUM BROMIDE 0.03 % NA SOLN: 2.0000 | Freq: Two times a day (BID) | NASAL | 0 refills | Status: DC

## 2021-09-30 NOTE — Progress Notes (Signed)

## 2021-09-30 NOTE — Progress Notes (Signed)
I have spent 5 minutes in review of e-visit questionnaire, review and updating patient chart, medical decision making and response to patient.   Kilah Drahos Cody Birch Farino, PA-C    

## 2021-10-16 ENCOUNTER — Ambulatory Visit: Payer: 59 | Admitting: Physician Assistant

## 2021-10-21 ENCOUNTER — Ambulatory Visit: Payer: 59 | Admitting: Gastroenterology

## 2021-12-09 ENCOUNTER — Ambulatory Visit: Payer: 59 | Admitting: Gastroenterology

## 2022-02-02 ENCOUNTER — Telehealth: Payer: Self-pay | Admitting: Gastroenterology

## 2022-02-02 NOTE — Telephone Encounter (Signed)
Patient call states her insurance will not cover the Anusol suppositories she is wondering if there is something else

## 2022-02-03 NOTE — Telephone Encounter (Signed)
Tried to return call to patient. Unable to leave message. Rings and then busy signal. If patient returns call , patient can purchase over the counter Preparation H suppositories and 2.5 mg Hydrocortisone Cream, patient can place pea size amount of Hydrocortisone cream at tip of suppository and insert rectally. Patient can also try using a good rx card at [facility] or [facility] for reduced cost.   I will try to contact patient again, this afternoon.

## 2022-02-04 NOTE — Telephone Encounter (Signed)
Left message on voicemail.

## 2022-02-05 ENCOUNTER — Ambulatory Visit: Payer: 59 | Admitting: Gastroenterology

## 2022-02-10 NOTE — Telephone Encounter (Signed)
Left message x3 for patient. If patient still needs assistance with prescription she will need to contact office back as I have not been able to reach patient.

## 2022-02-22 ENCOUNTER — Telehealth: Payer: 59 | Admitting: Physician Assistant

## 2022-02-22 DIAGNOSIS — J069 Acute upper respiratory infection, unspecified: Secondary | ICD-10-CM

## 2022-02-22 MED ORDER — AZELASTINE HCL 0.1 % NA SOLN: 1.0000 | Freq: Two times a day (BID) | NASAL | 0 refills | Status: DC

## 2022-02-22 MED ORDER — BENZONATATE 100 MG PO CAPS: 100.0000 mg | ORAL_CAPSULE | Freq: Three times a day (TID) | ORAL | 0 refills | Status: DC | PRN

## 2022-02-22 NOTE — Progress Notes (Signed)
E-Visit for Upper Respiratory Infection   We are sorry you are not feeling well.  Here is how we plan to help!  Based on what you have shared with me, it looks like you may have a viral upper respiratory infection.  Upper respiratory infections are caused by a large number of viruses; however, rhinovirus is the most common cause.   Symptoms vary from person to person, with common symptoms including sore throat, cough, fatigue or lack of energy and feeling of general discomfort.  A low-grade fever of up to 100.4 may present, but is often uncommon.  Symptoms vary however, and are closely related to a person's age or underlying illnesses.  The most common symptoms associated with an upper respiratory infection are nasal discharge or congestion, cough, sneezing, headache and pressure in the ears and face.  These symptoms usually persist for about 3 to 10 days, but can last up to 2 weeks.  It is important to know that upper respiratory infections do not cause serious illness or complications in most cases.    Upper respiratory infections can be transmitted from person to person, with the most common method of transmission being a person's hands.  The virus is able to live on the skin and can infect other persons for up to 2 hours after direct contact.  Also, these can be transmitted when someone coughs or sneezes; thus, it is important to cover the mouth to reduce this risk.  To keep the spread of the illness at bay, good hand hygiene is very important.  This is an infection that is most likely caused by a virus. There are no specific treatments other than to help you with the symptoms until the infection runs its course.  We are sorry you are not feeling well.  Here is how we plan to help!   For nasal congestion, you may use an oral decongestants such as Mucinex D or if you have glaucoma or high blood pressure use plain Mucinex.  Saline nasal spray or nasal drops can help and can safely be used as often as  needed for congestion.  For your congestion, I have prescribed Azelastine nasal spray two sprays in each nostril twice a day  If you do not have a history of heart disease, hypertension, diabetes or thyroid disease, prostate/bladder issues or glaucoma, you may also use Sudafed to treat nasal congestion.  It is highly recommended that you consult with a pharmacist or your primary care physician to ensure this medication is safe for you to take.     If you have a cough, you may use cough suppressants such as Delsym and Robitussin.  If you have glaucoma or high blood pressure, you can also use Coricidin HBP.   For cough I have prescribed for you A prescription cough medication called Tessalon Perles 100 mg. You may take 1-2 capsules every 8 hours as needed for cough  If you have a sore or scratchy throat, use a saltwater gargle-  to  teaspoon of salt dissolved in a 4-ounce to 8-ounce glass of warm water.  Gargle the solution for approximately 15-30 seconds and then spit.  It is important not to swallow the solution.  You can also use throat lozenges/cough drops and Chloraseptic spray to help with throat pain or discomfort.  Warm or cold liquids can also be helpful in relieving throat pain.  For headache, pain or general discomfort, you can use Ibuprofen or Tylenol as directed.   Some authorities believe   that zinc sprays or the use of Echinacea may shorten the course of your symptoms.   HOME CARE Only take medications as instructed by your medical team. Be sure to drink plenty of fluids. Water is fine as well as fruit juices, sodas and electrolyte beverages. You may want to stay away from caffeine or alcohol. If you are nauseated, try taking small sips of liquids. How do you know if you are getting enough fluid? Your urine should be a pale yellow or almost colorless. Get rest. Taking a steamy shower or using a humidifier may help nasal congestion and ease sore throat pain. You can place a towel over  your head and breathe in the steam from hot water coming from a faucet. Using a saline nasal spray works much the same way. Cough drops, hard candies and sore throat lozenges may ease your cough. Avoid close contacts especially the very young and the elderly Cover your mouth if you cough or sneeze Always remember to wash your hands.   GET HELP RIGHT AWAY IF: You develop worsening fever. If your symptoms do not improve within 10 days You develop yellow or green discharge from your nose over 3 days. You have coughing fits You develop a severe head ache or visual changes. You develop shortness of breath, difficulty breathing or start having chest pain Your symptoms persist after you have completed your treatment plan  MAKE SURE YOU  Understand these instructions. Will watch your condition. Will get help right away if you are not doing well or get worse.  Thank you for choosing an e-visit.  Your e-visit answers were reviewed by a board certified advanced clinical practitioner to complete your personal care plan. Depending upon the condition, your plan could have included both over the counter or prescription medications.  Please review your pharmacy choice. Make sure the pharmacy is open so you can pick up prescription now. If there is a problem, you may contact your provider through MyChart messaging and have the prescription routed to another pharmacy.  Your safety is important to us. If you have drug allergies check your prescription carefully.   For the next 24 hours you can use MyChart to ask questions about today's visit, request a non-urgent call back, or ask for a work or school excuse. You will get an email in the next two days asking about your experience. I hope that your e-visit has been valuable and will speed your recovery.  I have spent 5 minutes in review of e-visit questionnaire, review and updating patient chart, medical decision making and response to patient.    Jenet Durio M Roque Schill, PA-C  

## 2022-02-26 ENCOUNTER — Ambulatory Visit: Payer: 59 | Admitting: Nurse Practitioner

## 2022-02-26 VITALS — BP 124/78 | HR 82 | Temp 98.2°F | Ht 61.0 in | Wt 186.2 lb

## 2022-02-26 DIAGNOSIS — Z113 Encounter for screening for infections with a predominantly sexual mode of transmission: Secondary | ICD-10-CM | POA: Diagnosis not present

## 2022-02-26 DIAGNOSIS — U071 COVID-19: Secondary | ICD-10-CM | POA: Diagnosis not present

## 2022-02-26 DIAGNOSIS — R051 Acute cough: Secondary | ICD-10-CM | POA: Insufficient documentation

## 2022-02-26 LAB — POCT INFLUENZA A/B
Influenza A, POC: NEGATIVE
Influenza B, POC: NEGATIVE

## 2022-02-26 LAB — URINALYSIS
Bilirubin Urine: NEGATIVE
Hgb urine dipstick: NEGATIVE
Ketones, ur: NEGATIVE
Leukocytes,Ua: NEGATIVE
Nitrite: NEGATIVE
Specific Gravity, Urine: 1.025 (ref 1.000–1.030)
Total Protein, Urine: NEGATIVE
Urine Glucose: NEGATIVE
Urobilinogen, UA: 2 — AB (ref 0.0–1.0)
pH: 6.5 (ref 5.0–8.0)

## 2022-02-26 LAB — POC COVID19 BINAXNOW: SARS Coronavirus 2 Ag: POSITIVE — AB

## 2022-02-26 NOTE — Assessment & Plan Note (Signed)
Acute, positive for COVID-19 on point-of-care testing.  No adventitious lung sounds, vital signs stable with oxygen saturation 97% on room air, no evidence for acute pneumonia currently.  Outside of window to treat with antiviral medication recommend symptom management.  Continue Tessalon Perles.  Call office if symptoms persist or worsen.

## 2022-02-26 NOTE — Progress Notes (Signed)
Established Patient Office Visit  Subjective   Patient ID: Lynn Roth, female    DOB: 08/29/2000  Age: 21 y.o. MRN: [id number]  No chief complaint on file.  Symptoms started 2 weeks reports that cough is slowly getting better but is still present.  Is experiencing congestion, cough, pain with coughing spells, diarrhea, nausea, and vomiting.  Has not tested for COVID-19 at home.  Has been exposed to sick contact at her job.  Underwent E-visit a few days ago was prescribed Tessalon Perles which has resulted in some improvement with her cough as well.  Reports seeing OB/GYN earlier this year and was found positive trichomonas, she reports having completed treatment but would like to be tested for STIs today if possible.    Review of Systems  Constitutional:  Negative for chills and fever.  HENT:  Positive for congestion. Negative for sinus pain.   Respiratory:  Positive for cough.   Cardiovascular:  Positive for chest pain (intermittent with coughing spells).  Gastrointestinal:  Positive for diarrhea, nausea and vomiting.  Musculoskeletal:  Negative for myalgias.  Neurological:  Negative for headaches.      Objective:     BP 124/78   Pulse 82   Temp 98.2 F (36.8 C) (Oral)   Ht '5\' 1"'$  (1.549 m)   Wt 186 lb 4 oz (84.5 kg)   SpO2 97%   BMI 35.19 kg/m    Physical Exam Vitals reviewed.  Constitutional:      General: She is not in acute distress.    Appearance: Normal appearance.  HENT:     Head: Normocephalic and atraumatic.  Neck:     Vascular: No carotid bruit.  Cardiovascular:     Rate and Rhythm: Normal rate and regular rhythm.     Pulses: Normal pulses.     Heart sounds: Normal heart sounds.  Pulmonary:     Effort: Pulmonary effort is normal.     Breath sounds: Normal breath sounds.  Skin:    General: Skin is warm and dry.  Neurological:     General: No focal deficit present.     Mental Status: She is alert and oriented to person, place, and time.   Psychiatric:        Mood and Affect: Mood normal.        Behavior: Behavior normal.        Judgment: Judgment normal.      No results found for any visits on 02/26/22.    The ASCVD Risk score (Arnett DK, et al., 2019) failed to calculate for the following reasons:   The 2019 ASCVD risk score is only valid for ages 31 to 49    Assessment & Plan:   Problem List Items Addressed This Visit       Other   Routine screening for STI (sexually transmitted infection)    Will check labs for evidence of STIs, did discuss that vaginal swab is more sensitive to detect trichomonas so she may want a follow-up with OB/GYN pending these results to test for cure.  Patient reports understanding.      Relevant Orders   Urinalysis   Chlamydia/Neisseria Gonorrhoeae RNA,TMA,Urogenital   HIV Antibody (routine testing w rflx)   RPR   Urine Culture   Acute cough - Primary    Acute, positive for COVID-19 on point-of-care testing.  No adventitious lung sounds, vital signs stable with oxygen saturation 97% on room air, no evidence for acute pneumonia currently.  Outside of window  to treat with antiviral medication recommend symptom management.  Continue Tessalon Perles.  Call office if symptoms persist or worsen.       Return if symptoms worsen or fail to improve.    Ailene Ards, NP

## 2022-02-26 NOTE — Assessment & Plan Note (Signed)
Will check labs for evidence of STIs, did discuss that vaginal swab is more sensitive to detect trichomonas so she may want a follow-up with OB/GYN pending these results to test for cure.  Patient reports understanding.

## 2022-02-28 LAB — URINE CULTURE

## 2022-02-28 LAB — HIV ANTIBODY (ROUTINE TESTING W REFLEX): HIV 1&2 Ab, 4th Generation: NONREACTIVE

## 2022-02-28 LAB — RPR: RPR Ser Ql: NONREACTIVE

## 2022-03-01 LAB — CHLAMYDIA/NEISSERIA GONORRHOEAE RNA,TMA,UROGENTIAL
C. trachomatis RNA, TMA: NOT DETECTED
N. gonorrhoeae RNA, TMA: NOT DETECTED

## 2022-04-05 ENCOUNTER — Telehealth: Payer: Self-pay | Admitting: Gastroenterology

## 2022-04-05 NOTE — Telephone Encounter (Signed)
Inbound call from patient stating that she has an appointment on 12/20 with Dr. Rush Landmark and at her last appointment she was proscribed a medication and was told to see how it helped her. Patient stated that she was not able to get the medication because insurance would not cover, and is wanting to know if there is an alternative medication she can take and if so, does she need to move her appointment for a later time. Please advise.

## 2022-04-05 NOTE — Telephone Encounter (Signed)
The pt states that she tried to pick up anusol suppositories and her insurance will not cover.  She is going to have the pharmacy send over a PA. She has an appt for 12/20 and will keep that as well to discuss.

## 2022-04-07 ENCOUNTER — Ambulatory Visit: Payer: 59 | Admitting: Gastroenterology

## 2022-04-07 ENCOUNTER — Encounter: Payer: Self-pay | Admitting: Gastroenterology

## 2022-04-07 VITALS — BP 122/82 | HR 83 | Ht 61.0 in | Wt 190.0 lb

## 2022-04-07 DIAGNOSIS — K649 Unspecified hemorrhoids: Secondary | ICD-10-CM | POA: Diagnosis not present

## 2022-04-07 DIAGNOSIS — K625 Hemorrhage of anus and rectum: Secondary | ICD-10-CM

## 2022-04-07 MED ORDER — HYDROCORTISONE ACETATE 25 MG RE SUPP: 25.0000 mg | Freq: Two times a day (BID) | RECTAL | 1 refills | Status: AC

## 2022-04-07 NOTE — Progress Notes (Signed)
[facility] VISIT   Primary Care Provider Lynn Roth, Lynn Roth   Patient Profile: Lynn Roth is a 21 y.o. female with a pmh significant for sickle cell trait, allergies, hemorrhoids.  The patient presents to the [facility] for an evaluation and management of problem(s) noted below:  Problem List 1. Rectal bleeding   2. Hemorrhoids, unspecified hemorrhoid type     History of Present Illness Please see prior notes for full details of HPI.  Interval History The patient returns for follow up.  She was not able to get the Anusol suppositories after last visit for unclear reasons, albeit potentially cost vs insurance issues.  She is still noticing blood while wiping with the toilet paper.  She has tried to use wet wipes instead and still sees issues.  She has not noted severe blood clots however.  Bowels are moving regularly and she is not straining.    GI Review of Systems Positive as above Negative for pyrosis, dysphagia, odynophagia, pain, nausea, vomiting, alteration of bowel habits  Review of Systems General: Denies fevers/chills/weight loss unintentionally Cardiovascular: Denies chest pain Pulmonary: Denies shortness of breath Gastroenterological: See HPI Genitourinary: Denies darkened urine Hematological: Denies easy bruising/bleeding Dermatological: Denies jaundice Psychological: Mood is stable   Medications Current Outpatient Medications  Medication Sig Dispense Refill   azelastine (ASTELIN) 0.1 % nasal spray Place 1 spray into both nostrils 2 (two) times daily. Use in each nostril as directed 30 mL 0   benzonatate (TESSALON) 100 MG capsule Take 1 capsule (100 mg total) by mouth 3 (three) times daily as needed. 30 capsule 0   hydrocortisone (ANUSOL-HC) 25 MG suppository Place 1 suppository (25 mg total) rectally every 12 (twelve) hours. 12 suppository 1    levocetirizine (XYZAL) 5 MG tablet Take 1 tablet (5 mg total) by mouth every evening. 30 tablet 11   No current facility-administered medications for this visit.    Allergies No Known Allergies  Histories Past Medical History:  Diagnosis Date   Allergy started 08/04/2007   Otitis media 03/19/2005   Sickle cell trait (HCC)    Past Surgical History:  Procedure Laterality Date   NO PAST SURGERIES     Social History   Socioeconomic History   Marital status: Single    Spouse name: Not on file   Number of children: Not on file   Years of education: Not on file   Highest education level: Not on file  Occupational History   Not on file  Tobacco Use   Smoking status: Never   Smokeless tobacco: Never  Vaping Use   Vaping Use: Never used  Substance and Sexual Activity   Alcohol use: No   Drug use: No   Sexual activity: Never    Birth control/protection: Implant  Other Topics Concern   Not on file  Social History Narrative   [employer]   7th grade   Basketball and cheerleading.   Social Determinants of Health   Financial Resource Strain: Not on file  Food Insecurity: Not on file  Transportation Needs: Not on file  Physical Activity: Not on file  Stress: Not on file  Social Connections: Not on file  Intimate Partner Violence: Not on file   Family History  Problem Relation Age of Onset   Hypertension Mother 61       medication.   Asthma Brother    Hypertension Maternal Aunt    Sickle  cell anemia Maternal Uncle    Diabetes Maternal Grandmother    Hypertension Maternal Grandfather    Vision loss Maternal Grandfather        glac[place]   Hypertension Paternal Grandmother    Kidney disease Neg Hx    Stroke Neg Hx    Hyperlipidemia Neg Hx    Stomach cancer Neg Hx    Colon cancer Neg Hx    Esophageal cancer Neg Hx    Pancreatic cancer Neg Hx    Liver disease Neg Hx    Inflammatory bowel disease Neg Hx    Rectal cancer Neg Hx    I have reviewed her  medical, social, and family history in detail and updated the electronic medical record as necessary.    PHYSICAL EXAMINATION  BP 122/82   Pulse 83   Ht '5\' 1"'$  (1.549 m)   Wt 190 lb (86.2 kg)   BMI 35.90 kg/m  Wt Readings from Last 3 Encounters:  04/07/22 190 lb (86.2 kg)  02/26/22 186 lb 4 oz (84.5 kg)  08/25/21 186 lb 2 oz (84.4 kg)  GEN: NAD, appears stated age, doesn't appear chronically ill PSYCH: Cooperative, without pressured speech EYE: Conjunctivae pink, sclerae anicteric ENT: MMM CV: Nontachycardic RESP: No audible wheezing GI: NABS, soft, ND, without rebound MSK/EXT: No lower extremity edema SKIN: No jaundice NEURO:  Alert & Oriented x 3   REVIEW OF DATA  I reviewed the following data at the time of this encounter:  GI Procedures and Studies  Previously reviewed  Laboratory Studies  Reviewed those in epic  Imaging Studies  No relevant studies to review   ASSESSMENT  [name] is a 21 y.o. female with a pmh significant for sickle cell trait, allergies, hemorrhoids.  The patient is seen today for evaluation and management of:  1. Rectal bleeding   2. Hemorrhoids, unspecified hemorrhoid type    The patient remains hemodynamically stable.  Clinically she has not had significant changes from our last visit.  We looked up on GoodRx Anusol suppositories and found that they are covered with a different pharmacy at a better cost.  She is going to try these this time.  She will also consider use of Calmol-4 suppositories in effort of trying to optimize her hemorrhoids and see if this helps.  She has some external component to things, but her internal components seem like reasonable candidates for possible banding if necessary.  She will use fiber supplementation as well moving froward.  She can MyChart me in the coming weeks to let me know where things stand and in case we need to place referral to one of my partners to attempt hemorrhoidal banding in the future.  We  discussed the risks/benefits of this therapy and information was given to her as well.  All patient questions were answered to the best of my ability, and the patient agrees to the aforementioned plan of action with follow-up as indicated.   PLAN  Increase dietary fiber Start Fibercon/Benefiber/Metamucil daily (reinforced) Toileting techniques discussed again Initiate Anusol suppositories - Nightly x1 week then every other night until 2 full prescriptions have been completed Initiate Calmol-4 OTC Nightly MyChart and update [place] to see how things are doing -If patient still having issues then will refer to one of my partners for attempt at internal hemorrhoidal banding (though have discussed with her external hemorrhoids CRS evaluation could be required in future as well)   No orders of the defined types were placed in this encounter.  New Prescriptions   HYDROCORTISONE (ANUSOL-HC) 25 MG SUPPOSITORY    Place 1 suppository (25 mg total) rectally every 12 (twelve) hours.   Modified Medications   No medications on file    Planned Follow Up No follow-ups on file.   Total Time in Face-to-Face and in Coordination of Care for patient including independent/personal interpretation/review of prior testing, medical history, examination, medication adjustment, communicating results with the patient directly, and documentation within the EHR is 25 minutes.   Justice Britain, MD [facility] Office # [phone]

## 2022-04-07 NOTE — Patient Instructions (Signed)
A high fiber diet with plenty of fluids (up to 8 glasses of water daily) is suggested to relieve these symptoms.  Fiber -Con , 1 table once  daily can be used to keep bowels regular if needed.  Use Anusol Suppositories - Insert 1 suppository rectally nightly for 7 nights, then use 1 suppository nightly every other night until completion of prescription.   You may also consider using Calmol 4 hemorrhoidal Suppositories as directed. Samples given to you today.   Send my chart message in 6 weeks with updates on your symptoms. If your not better by then, we may consider Hemorrhoid Banding with Dr. Silverio Decamp.   Toileting tips to help with your constipation - Drink at least 64-80 ounces of water/liquid per day. - Establish a time to try to move your bowels every day.  For many people, this is after a cup of coffee or after a meal such as breakfast. - Sit all of the way back on the toilet keeping your back fairly straight and while sitting up, try to rest the tops of your forearms on your upper thighs.   - Raising your feet with a step stool/squatty potty can be helpful to improve the angle that allows your stool to pass through the rectum. - Relax the rectum feeling it bulge toward the toilet water.  If you feel your rectum raising toward your body, you are contracting rather than relaxing. - Breathe in and slowly exhale. "Belly breath" by expanding your belly towards your belly button. Keep belly expanded as you gently direct pressure down and back to the anus.  A low pitched GRRR sound can assist with increasing intra-abdominal pressure.  - Repeat 3-4 times. If unsuccessful, contract the pelvic floor to restore normal tone and get off the toilet.  Avoid excessive straining. - To reduce excessive wiping by teaching your anus to normally contract, place hands on outer aspect of knees and resist knee movement outward.  Hold 5-10 second then place hands just inside of knees and resist inward movement of  knees.  Hold 5 seconds.  Repeat a few times each way.  Thank you for choosing me and [facility].  Dr. Rush Landmark

## 2022-04-09 DIAGNOSIS — K625 Hemorrhage of anus and rectum: Secondary | ICD-10-CM | POA: Insufficient documentation

## 2022-04-09 DIAGNOSIS — K649 Unspecified hemorrhoids: Secondary | ICD-10-CM | POA: Insufficient documentation

## 2022-04-19 ENCOUNTER — Telehealth: Payer: 59 | Admitting: Physician Assistant

## 2022-04-19 DIAGNOSIS — R6889 Other general symptoms and signs: Secondary | ICD-10-CM

## 2022-04-19 MED ORDER — ALBUTEROL SULFATE HFA 108 (90 BASE) MCG/ACT IN AERS: 2.0000 | INHALATION_SPRAY | Freq: Four times a day (QID) | RESPIRATORY_TRACT | 0 refills | Status: DC | PRN

## 2022-04-19 NOTE — Progress Notes (Signed)
I have spent 5 minutes in review of e-visit questionnaire, review and updating patient chart, medical decision making and response to patient.   Avalynn Bowe Cody Arisa Congleton, PA-C    

## 2022-04-19 NOTE — Progress Notes (Signed)
Message sent to patient requesting further input regarding current symptoms. Awaiting patient response.  

## 2022-04-19 NOTE — Progress Notes (Signed)
E visit for Flu like symptoms   We are sorry that you are not feeling well.  Here is how we plan to help! Based on what you have shared with me it looks like you may have flu-like symptoms that should be watched but do not seem to indicate anti-viral treatment.  Influenza or "the flu" is   an infection caused by a respiratory virus. The flu virus is highly contagious and persons who did not receive their yearly flu vaccination may "catch" the flu from close contact.  We have anti-viral medications to treat the viruses that cause this infection. They are not a "cure" and only shorten the course of the infection. These prescriptions are most effective when they are given within the first 2 days of "flu" symptoms. Antiviral medication are indicated if you have a high risk of complications from the flu.  Based upon your symptoms and potential risk factors I recommend that you follow the flu symptoms recommendation that I have listed below.  Please keep well-hydrated and try to get plenty of rest. If you have a humidifier, place it in the bedroom and run it at night. Start a saline nasal rinse for nasal congestion. You can consider use of a nasal steroid spray like Flonase or Nasacort OTC. You can alternate between Tylenol and Ibuprofen if needed for fever, body aches, headache and/or throat pain. Salt water-gargles and chloraseptic spray can be very beneficial for sore throat. Mucinex-DM for congestion or cough. Please take all prescribed medications as directed.  Remain out of work until [employer] for 24 hours without a fever-reducing medication, and you are feeling better.  You should mask until symptoms are resolved.  If anything worsens despite treatment, you need to be evaluated in-person. Please do not delay care.  ANYONE WHO HAS FLU SYMPTOMS SHOULD: Stay home. The flu is highly contagious and going out or to work exposes others! Be sure to drink plenty of fluids. Water is fine as well as  fruit juices, sodas and electrolyte beverages. You may want to stay away from caffeine or alcohol. If you are nauseated, try taking small sips of liquids. How do you know if you are getting enough fluid? Your urine should be a pale yellow or almost colorless. Get rest. Taking a steamy shower or using a humidifier may help nasal congestion and ease sore throat pain. Using a saline nasal spray works much the same way. Cough drops, hard candies and sore throat lozenges may ease your cough. Line up a caregiver. Have someone check on you regularly.   GET HELP RIGHT AWAY IF: You cannot keep down liquids or your medications. You become short of breath Your fell like you are going to pass out or loose consciousness. Your symptoms persist after you have completed your treatment plan MAKE SURE YOU  Understand these instructions. Will watch your condition. Will get help right away if you are not doing well or get worse.  Your e-visit answers were reviewed by a board certified advanced clinical practitioner to complete your personal care plan.  Depending on the condition, your plan could have included both over the counter or prescription medications.  If there is a problem please reply  once you have received a response from your provider.  Your safety is important to [place].  If you have drug allergies check your prescription carefully.    You can use MyChart to ask questions about today's visit, request a non-urgent call back, or ask for a work or  school excuse for 24 hours related to this e-Visit. If it has been greater than 24 hours you will need to follow up with your provider, or enter a new e-Visit to address those concerns.  You will get an e-mail in the next two days asking about your experience.  I hope that your e-visit has been valuable and will speed your recovery. Thank you for using e-visits.

## 2022-05-03 ENCOUNTER — Telehealth: Payer: 59 | Admitting: Physician Assistant

## 2022-05-03 DIAGNOSIS — N760 Acute vaginitis: Secondary | ICD-10-CM

## 2022-05-03 DIAGNOSIS — B9689 Other specified bacterial agents as the cause of diseases classified elsewhere: Secondary | ICD-10-CM | POA: Diagnosis not present

## 2022-05-03 MED ORDER — METRONIDAZOLE 500 MG PO TABS: 500.0000 mg | ORAL_TABLET | Freq: Two times a day (BID) | ORAL | 0 refills | Status: DC

## 2022-05-03 NOTE — Progress Notes (Signed)
I have spent 5 minutes in review of e-visit questionnaire, review and updating patient chart, medical decision making and response to patient.   Takelia Urieta Cody Aliena Ghrist, PA-C    

## 2022-05-03 NOTE — Progress Notes (Signed)
Message sent to patient requesting further input regarding current symptoms. Awaiting patient response.  

## 2022-05-03 NOTE — Progress Notes (Signed)

## 2022-07-05 ENCOUNTER — Telehealth: Payer: 59 | Admitting: Nurse Practitioner

## 2022-07-05 DIAGNOSIS — N760 Acute vaginitis: Secondary | ICD-10-CM | POA: Diagnosis not present

## 2022-07-05 DIAGNOSIS — B9689 Other specified bacterial agents as the cause of diseases classified elsewhere: Secondary | ICD-10-CM

## 2022-07-05 MED ORDER — METRONIDAZOLE 0.75 % VA GEL: 1.0000 | Freq: Every day | VAGINAL | 0 refills | Status: AC

## 2022-07-05 NOTE — Progress Notes (Signed)
E-Visit for Vaginal Symptoms  We are sorry that you are not feeling well. Here is how we plan to help! Based on what you shared with me it looks like you: May have a vaginosis due to bacteria Please note because we have already treated you for this one time virtually, we will go ahead with treatment this time, but if/when symptoms recur or if symptoms continue you will need to be seen in person for evaluation and testing.   Vaginosis is an inflammation of the vagina that can result in discharge, itching and pain. The cause is usually a change in the normal balance of vaginal bacteria or an infection. Vaginosis can also result from reduced estrogen levels after menopause.  The most common causes of vaginosis are:   Bacterial vaginosis which results from an overgrowth of one on several organisms that are normally present in your vagina.   Yeast infections which are caused by a naturally occurring fungus called candida.   Vaginal atrophy (atrophic vaginosis) which results from the thinning of the vagina from reduced estrogen levels after menopause.   Trichomoniasis which is caused by a parasite and is commonly transmitted by sexual intercourse.  Factors that increase your risk of developing vaginosis include: Medications, such as antibiotics and steroids Uncontrolled diabetes Use of hygiene products such as bubble bath, vaginal spray or vaginal deodorant Douching Wearing damp or tight-fitting clothing Using an intrauterine device (IUD) for birth control Hormonal changes, such as those associated with pregnancy, birth control pills or menopause Sexual activity Having a sexually transmitted infection  Your treatment plan is :  Meds ordered this encounter  Medications   metroNIDAZOLE (METROGEL) 0.75 % vaginal gel    Sig: Place 1 Applicatorful vaginally at bedtime for 7 days.    Dispense:  70 g    Refill:  0     Be sure to take all of the medication as directed. Stop taking any  medication if you develop a rash, tongue swelling or shortness of breath. Mothers who are breast feeding should consider pumping and discarding their breast milk while on these antibiotics. However, there is no consensus that infant exposure at these doses would be harmful.  Remember that medication creams can weaken latex condoms. Marland Kitchen   HOME CARE:  Good hygiene may prevent some types of vaginosis from recurring and may relieve some symptoms:  Avoid baths, hot tubs and whirlpool spas. Rinse soap from your outer genital area after a shower, and dry the area well to prevent irritation. Don't use scented or harsh soaps, such as those with deodorant or antibacterial action. Avoid irritants. These include scented tampons and pads. Wipe from front to back after using the toilet. Doing so avoids spreading fecal bacteria to your vagina.  Other things that may help prevent vaginosis include:  Don't douche. Your vagina doesn't require cleansing other than normal bathing. Repetitive douching disrupts the normal organisms that reside in the vagina and can actually increase your risk of vaginal infection. Douching won't clear up a vaginal infection. Use a latex condom. Both female and female latex condoms may help you avoid infections spread by sexual contact. Wear cotton underwear. Also wear pantyhose with a cotton crotch. If you feel comfortable without it, skip wearing underwear to bed. Yeast thrives in [employer] Your symptoms should improve in the next day or two.  GET HELP RIGHT AWAY IF:  You have pain in your lower abdomen ( pelvic area or over your ovaries) You develop nausea or vomiting  You develop a fever Your discharge changes or worsens You have persistent pain with intercourse You develop shortness of breath, a rapid pulse, or you faint.  These symptoms could be signs of problems or infections that need to be evaluated by a medical provider now.  MAKE SURE YOU   Understand these  instructions. Will watch your condition. Will get help right away if you are not doing well or get worse.  Thank you for choosing an e-visit.  Your e-visit answers were reviewed by a board certified advanced clinical practitioner to complete your personal care plan. Depending upon the condition, your plan could have included both over the counter or prescription medications.  Please review your pharmacy choice. Make sure the pharmacy is open so you can pick up prescription now. If there is a problem, you may contact your provider through [employer] and have the prescription routed to another pharmacy.  Your safety is important to [place]. If you have drug allergies check your prescription carefully.   For the next 24 hours you can use MyChart to ask questions about today's visit, request a non-urgent call back, or ask for a work or school excuse. You will get an email in the next two days asking about your experience. I hope that your e-visit has been valuable and will speed your recovery.   I spent approximately 5 minutes reviewing the patient's history, current symptoms and coordinating their care today.

## 2022-07-17 ENCOUNTER — Telehealth: Payer: 59 | Admitting: Family Medicine

## 2022-07-17 DIAGNOSIS — J029 Acute pharyngitis, unspecified: Secondary | ICD-10-CM

## 2022-07-17 NOTE — Progress Notes (Signed)
E-Visit for Sore Throat  We are sorry that you are not feeling well.  Here is how we plan to help!  Your symptoms indicate a likely viral infection (Pharyngitis).   Pharyngitis is inflammation in the back of the throat which can cause a sore throat, scratchiness and sometimes difficulty swallowing.   Pharyngitis is typically caused by a respiratory virus and will just run its course.  Please keep in mind that your symptoms could last up to 10 days.  For throat pain, we recommend over the counter oral pain relief medications such as acetaminophen or aspirin, or anti-inflammatory medications such as ibuprofen or naproxen sodium.  Topical treatments such as oral throat lozenges or sprays may be used as needed.  Avoid close contact with loved ones, especially the very young and elderly.  Remember to wash your hands thoroughly throughout the day as this is the number one way to prevent the spread of infection and wipe down door knobs and counters with disinfectant.  After careful review of your answers, I would not recommend an antibiotic for your condition.  Antibiotics should not be used to treat conditions that we suspect are caused by viruses like the virus that causes the common cold or flu. However, some people can have Strep with atypical symptoms. You may need formal testing in clinic or office to confirm if your symptoms continue or worsen.  Providers prescribe antibiotics to treat infections caused by bacteria. Antibiotics are very powerful in treating bacterial infections when they are used properly.  To maintain their effectiveness, they should be used only when necessary.  Overuse of antibiotics has resulted in the development of super bugs that are resistant to treatment!    Home Care: Only take medications as instructed by your medical team. Do not drink alcohol while taking these medications. A steam or ultrasonic humidifier can help congestion.  You can place a towel over your head and  breathe in the steam from hot water coming from a faucet. Avoid close contacts especially the very young and the elderly. Cover your mouth when you cough or sneeze. Always remember to wash your hands.  Get Help Right Away If: You develop worsening fever or throat pain. You develop a severe head ache or visual changes. Your symptoms persist after you have completed your treatment plan.  Make sure you Understand these instructions. Will watch your condition. Will get help right away if you are not doing well or get worse.   Thank you for choosing an e-visit.  Your e-visit answers were reviewed by a board certified advanced clinical practitioner to complete your personal care plan. Depending upon the condition, your plan could have included both over the counter or prescription medications.  Please review your pharmacy choice. Make sure the pharmacy is open so you can pick up prescription now. If there is a problem, you may contact your provider through MyChart messaging and have the prescription routed to another pharmacy.  Your safety is important to us. If you have drug allergies check your prescription carefully.   For the next 24 hours you can use MyChart to ask questions about today's visit, request a non-urgent call back, or ask for a work or school excuse. You will get an email in the next two days asking about your experience. I hope that your e-visit has been valuable and will speed your recovery.    have provided 5 minutes of non face to face time during this encounter for chart review and documentation.   

## 2022-09-21 ENCOUNTER — Other Ambulatory Visit (HOSPITAL_COMMUNITY)
Admission: RE | Admit: 2022-09-21 | Discharge: 2022-09-21 | Disposition: A | Discharge status: Home / Self Care | Payer: 59 | Source: Ambulatory Visit | Attending: Family Medicine | Admitting: Family Medicine

## 2022-09-21 ENCOUNTER — Ambulatory Visit: Payer: 59 | Admitting: Family Medicine

## 2022-09-21 ENCOUNTER — Encounter: Payer: Self-pay | Admitting: Family Medicine

## 2022-09-21 VITALS — HR 92 | Ht 61.0 in | Wt 189.0 lb

## 2022-09-21 DIAGNOSIS — J309 Allergic rhinitis, unspecified: Secondary | ICD-10-CM | POA: Diagnosis not present

## 2022-09-21 DIAGNOSIS — Z0000 Encounter for general adult medical examination without abnormal findings: Secondary | ICD-10-CM | POA: Diagnosis not present

## 2022-09-21 DIAGNOSIS — K649 Unspecified hemorrhoids: Secondary | ICD-10-CM | POA: Diagnosis not present

## 2022-09-21 DIAGNOSIS — N898 Other specified noninflammatory disorders of vagina: Secondary | ICD-10-CM

## 2022-09-21 MED ORDER — LEVOCETIRIZINE DIHYDROCHLORIDE 5 MG PO TABS: 5.0000 mg | ORAL_TABLET | Freq: Every evening | ORAL | 11 refills | Status: AC

## 2022-09-21 MED ORDER — PREPARATION H 0.25-50 % EX GEL: 1.0000 | CUTANEOUS | 1 refills | Status: AC | PRN

## 2022-09-21 NOTE — Progress Notes (Unsigned)
New Patient Visit  HPI:  Patient presents today for a new patient appointment to establish general primary care.  Prior PCP: Robin Searing, last seen last year for COVID  Other care team members: OBGYN, GI   Concerns today: vaginal irritation, would like STD testing  Says she has an vaginal itching without discharge since May 14th after the end of her period. Denies discharge, but does endorse odor.  Last July had Trichomoniasis. This is a different partner from then. Has had one sexual partner for last 6 months.    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   No problem-specific Assessment & Plan notes found for this encounter.     FOLLOW UP: Follow up in *** for ***  Lockie Mola, MD [facility]

## 2022-09-21 NOTE — Patient Instructions (Signed)
It was wonderful to see you today.  Please bring ALL of your medications with you to every visit.   Today we talked about:  Vaginal irritation - I will be prescribing you a medication after I get the results of the test. If your symptoms get worse, you can message me on mychart.   Hemorrhoids - I prescribed a gel called preparation H. I want you to also take miralax 1-2 caps a day until you have consistent stool that looks like soft serve.   Please follow up in 3 months   Thank you for choosing [facility].   Please call [phone] with any questions about today's appointment.  Please be sure to schedule follow up at the front desk before you leave today.   Lockie Mola, MD  Family Medicine

## 2022-09-22 DIAGNOSIS — N898 Other specified noninflammatory disorders of vagina: Secondary | ICD-10-CM | POA: Insufficient documentation

## 2022-09-22 LAB — CBC WITH DIFFERENTIAL/PLATELET
Basophils Absolute: 0.1 10*3/uL (ref 0.0–0.2)
Basos: 1 %
EOS (ABSOLUTE): 0.1 10*3/uL (ref 0.0–0.4)
Eos: 1 %
Hematocrit: 35.1 % (ref 34.0–46.6)
Hemoglobin: 11.8 g/dL (ref 11.1–15.9)
Immature Grans (Abs): 0 10*3/uL (ref 0.0–0.1)
Immature Granulocytes: 0 %
Lymphocytes Absolute: 1.9 10*3/uL (ref 0.7–3.1)
Lymphs: 18 %
MCH: 29.9 pg (ref 26.6–33.0)
MCHC: 33.6 g/dL (ref 31.5–35.7)
MCV: 89 fL (ref 79–97)
Monocytes Absolute: 0.6 10*3/uL (ref 0.1–0.9)
Monocytes: 5 %
Neutrophils Absolute: 7.6 10*3/uL — ABNORMAL HIGH (ref 1.4–7.0)
Neutrophils: 75 %
Platelets: 394 10*3/uL (ref 150–450)
RBC: 3.94 x10E6/uL (ref 3.77–5.28)
RDW: 12.8 % (ref 11.7–15.4)
WBC: 10.3 10*3/uL (ref 3.4–10.8)

## 2022-09-22 LAB — LIPID PANEL
Chol/HDL Ratio: 4.3 ratio (ref 0.0–4.4)
Cholesterol, Total: 160 mg/dL (ref 100–199)
HDL: 37 mg/dL — ABNORMAL LOW (ref >39)
LDL Chol Calc (NIH): 105 mg/dL — ABNORMAL HIGH (ref 0–99)
Triglycerides: 99 mg/dL (ref 0–149)
VLDL Cholesterol Cal: 18 mg/dL (ref 5–40)

## 2022-09-22 LAB — COMPREHENSIVE METABOLIC PANEL
ALT: 15 IU/L (ref 0–32)
AST: 17 IU/L (ref 0–40)
Albumin/Globulin Ratio: 1.4 (ref 1.2–2.2)
Albumin: 4.1 g/dL (ref 4.0–5.0)
Alkaline Phosphatase: 59 IU/L (ref 44–121)
BUN/Creatinine Ratio: 11 (ref 9–23)
BUN: 7 mg/dL (ref 6–20)
Bilirubin Total: 0.5 mg/dL (ref 0.0–1.2)
CO2: 23 mmol/L (ref 20–29)
Calcium: 9.2 mg/dL (ref 8.7–10.2)
Chloride: 104 mmol/L (ref 96–106)
Creatinine, Ser: 0.63 mg/dL (ref 0.57–1.00)
Globulin, Total: 3 g/dL (ref 1.5–4.5)
Glucose: 79 mg/dL (ref 70–99)
Potassium: 4.1 mmol/L (ref 3.5–5.2)
Sodium: 140 mmol/L (ref 134–144)
Total Protein: 7.1 g/dL (ref 6.0–8.5)
eGFR: 129 mL/min/{1.73_m2} (ref >59)

## 2022-09-22 LAB — CERVICOVAGINAL ANCILLARY ONLY
Bacterial Vaginitis (gardnerella): NEGATIVE
Candida Glabrata: NEGATIVE
Candida Vaginitis: NEGATIVE
Chlamydia: NEGATIVE
Comment: NEGATIVE
Comment: NEGATIVE
Comment: NEGATIVE
Comment: NEGATIVE
Comment: NEGATIVE
Comment: NORMAL
Neisseria Gonorrhea: NEGATIVE
Trichomonas: NEGATIVE

## 2022-09-22 LAB — HIV ANTIBODY (ROUTINE TESTING W REFLEX): HIV Screen 4th Generation wRfx: NONREACTIVE

## 2022-09-22 LAB — HCV AB W REFLEX TO QUANT PCR: HCV Ab: NONREACTIVE

## 2022-09-22 LAB — HCV INTERPRETATION

## 2022-09-22 NOTE — Assessment & Plan Note (Signed)
Patient's vaginal irritation most likely from BV or yeast infection. Does not have risk factors for syphilis or HIV given one partner. However, could consider if GC/CT, and wet prep are negative. Will wait for results before treating.

## 2022-09-22 NOTE — Assessment & Plan Note (Addendum)
Hemorrhoids most likely caused by underlying constipation. No family history of colon cancer, no weight loss. Unsure how much blood patient is losing. Patient also mentioned having heavy periods. Given flow murmur will check CBC.  - Follow up in 2 months

## 2022-11-10 ENCOUNTER — Encounter: Payer: Self-pay | Admitting: Family Medicine

## 2022-11-12 NOTE — Telephone Encounter (Signed)
Patient calls nurse line to schedule an apt.   Apt made for 7/29 with Dameron.   Patient requests a female.

## 2022-11-15 ENCOUNTER — Ambulatory Visit (INDEPENDENT_AMBULATORY_CARE_PROVIDER_SITE_OTHER): Payer: 59 | Admitting: Student

## 2022-11-15 ENCOUNTER — Other Ambulatory Visit (HOSPITAL_COMMUNITY)
Admission: RE | Admit: 2022-11-15 | Discharge: 2022-11-15 | Disposition: A | Discharge status: Home / Self Care | Payer: 59 | Source: Ambulatory Visit | Attending: Family Medicine | Admitting: Family Medicine

## 2022-11-15 ENCOUNTER — Other Ambulatory Visit: Payer: Self-pay

## 2022-11-15 VITALS — BP 127/86 | HR 71 | Ht 61.0 in | Wt 185.6 lb

## 2022-11-15 DIAGNOSIS — R238 Other skin changes: Secondary | ICD-10-CM | POA: Diagnosis not present

## 2022-11-15 DIAGNOSIS — N898 Other specified noninflammatory disorders of vagina: Secondary | ICD-10-CM | POA: Diagnosis present

## 2022-11-15 DIAGNOSIS — N9089 Other specified noninflammatory disorders of vulva and perineum: Secondary | ICD-10-CM | POA: Insufficient documentation

## 2022-11-15 LAB — POCT WET PREP (WET MOUNT)
Clue Cells Wet Prep Whiff POC: NEGATIVE
Trichomonas Wet Prep HPF POC: ABSENT

## 2022-11-15 NOTE — Progress Notes (Signed)
    SUBJECTIVE:   CHIEF COMPLAINT / HPI:   Lynn Roth is a pleasant 22 year old female here to discuss vaginal odor and rash.  Her LMP started 7/4 and used a tampon for the first time on 7/8 and changed them regularly every 4-8 hours. She was using Always pads. Her labia and inner thigh were itchy, and then turned wrong and she noticed some the skin flaking off.  She used a triamcinolone cream on the area that she is prescribed for something else. She is also having fishy odor. No change in discharge.  She would like testing for BV today.  No fever or chills. No rash elsewhere. Not taking any medicine  PERTINENT  PMH / PSH: Hemorrhoids, vaginal irritation  OBJECTIVE:   BP 127/86   Pulse 71   Ht 5\' 1"  (1.549 m)   Wt 185 lb 9.6 oz (84.2 kg)   SpO2 100%   BMI 35.07 kg/m  General: Pleasant and well-appearing GU: Daycia, CMA present as chaperone for exam.  Mild hypopigmentation of intertriginous region. External vulva and vagina nonerythematous, without any obvious lesions or rash. Yellowish tinted discharge appreciated.  Normal ruggae of vaginal walls.  Cervix is non erythematous and non-friable.     ASSESSMENT/PLAN:   Vulvar irritation Irritation of bilateral labia and inner thighs.   Also discussed trying different sanitary product brands if the one she is currently using seems to cause irritation. Encouraged her to follow-up in our clinic should she develop any skin flaking or rash again, there is potential this could be lichen sclerosis as she used triamcinolone cream which helped her symptoms go away.  Skin irritation Skin irritation bilateral inner thighs.  Mostly consistent with inner thigh chafing especially of on further discussion with patient that it is worse when she is wearing shorts or in the heat. On exam she has some signs consistent with postinflammatory hypopigmentation/hyperpigmentation.  However, no bleeding or crusting. Showed her examples of antichafing  balm that she can get to help with her symptoms.  May also use petroleum jelly.     Darral Dash, DO [facility]

## 2022-11-15 NOTE — Assessment & Plan Note (Addendum)
Irritation of bilateral labia and inner thighs.   Also discussed trying different sanitary product brands if the one she is currently using seems to cause irritation. Encouraged her to follow-up in our clinic should she develop any skin flaking or rash again, there is potential this could be lichen sclerosis as she used triamcinolone cream which helped her symptoms go away. POC wet prep negative for bacterial vaginosis and yeast today. -Collected gonorrhea, chlamydia, trichomonas testing today per patient request.  Will follow-up on results.

## 2022-11-15 NOTE — Patient Instructions (Addendum)
It was great seeing you today.  As we discussed, - We completed testing today. If anything is abnormal, I will call you.  The results may take a few days to return.  You do not have a yeast infection, or BV today. -You may try using a different brand of sanitary product, as the tampon or pad you are using may have been causing irritation. -Try using Vaseline or an entire chafing stick (he gets on [employer]) on her inner thighs. -If you notice that you develop another rash with flaking of skin or changing color, please make an appointment with our skin clinic so we can evaluate you at that time.   If you have any questions or concerns, please feel free to call the clinic.   Have a wonderful day,  Dr. Darral Dash [facility] [phone]

## 2022-11-15 NOTE — Assessment & Plan Note (Addendum)
Skin irritation bilateral inner thighs.  Mostly consistent with inner thigh chafing especially of on further discussion with patient that it is worse when she is wearing shorts or in the heat. On exam she has some signs consistent with postinflammatory hypopigmentation/hyperpigmentation.  However, no bleeding or crusting. Showed her examples of antichafing balm that she can get to help with her symptoms.  May also use petroleum jelly.

## 2023-03-28 ENCOUNTER — Ambulatory Visit: Payer: 59 | Admitting: Student

## 2023-03-28 ENCOUNTER — Ambulatory Visit: Payer: 59

## 2023-03-29 ENCOUNTER — Ambulatory Visit: Payer: 59 | Admitting: Student

## 2023-03-29 VITALS — BP 119/96 | HR 91 | Ht 61.0 in | Wt 193.0 lb

## 2023-03-29 DIAGNOSIS — J029 Acute pharyngitis, unspecified: Secondary | ICD-10-CM | POA: Diagnosis not present

## 2023-03-29 DIAGNOSIS — J020 Streptococcal pharyngitis: Secondary | ICD-10-CM

## 2023-03-29 LAB — POCT RAPID STREP A (OFFICE): Rapid Strep A Screen: POSITIVE — AB

## 2023-03-29 MED ORDER — AMOXICILLIN 500 MG PO TABS
500.0000 mg | ORAL_TABLET | Freq: Two times a day (BID) | ORAL | 0 refills | Status: AC
Start: 2023-03-29 — End: 2023-04-05

## 2023-03-29 NOTE — Progress Notes (Signed)
    SUBJECTIVE:   CHIEF COMPLAINT / HPI:   Sore throat Patient reports pharyngitis symptoms for 1 week.  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  Someone at her work is ill with sore throat.  No cough, no NVD.  She has a history of strep throat infections.  PERTINENT  PMH / PSH:   OBJECTIVE:   BP (!) 119/96   Pulse 91   Ht 5\' 1"  (1.549 m)   Wt 193 lb (87.5 kg)   LMP 03/14/2023   SpO2 98%   BMI 36.47 kg/m    General: NAD, pleasant, HEENT: Normocephalic, atraumatic head. EOM intact and normal conjunctiva BL. Normal external nose.  Erythematous pharynx, edematous tonsils.  No exudate, no deviation.  Normal dentition. Cardio: RRR, no MRG. Cap Refill <2s. Respiratory: CTAB, normal wob on RA GI: Abdomen is soft, not tender, not distended. BS present Skin: Warm and dry   ASSESSMENT/PLAN:   Assessment & Plan Strep pharyngitis Rapid strep test positive.  Symptoms consistent with strep pharyngitis. - Amoxicillin 500 mg twice daily, 7 days - Follow-up symptoms fail to improve   Follow-up recommendations Patient would like an appointment to discuss dysmenorrhea, recommend follow-up with me or PCP to discuss  Tiffany Kocher, DO [facility]

## 2023-03-29 NOTE — Patient Instructions (Signed)
It was great to see you! Thank you for allowing me to participate in your care!   I recommend that you always bring your medications to each appointment as this makes it easy to ensure we are on the correct medications and helps [place] not miss when refills are needed.  Our plans for today:  -Take amoxicillin twice daily for 7 days  Take care and seek immediate care sooner if you develop any concerns. Please remember to show up 15 minutes before your scheduled appointment time!  Tiffany Kocher, DO [facility]

## 2023-04-01 ENCOUNTER — Other Ambulatory Visit: Payer: Self-pay

## 2023-05-02 ENCOUNTER — Telehealth: Payer: 59 | Admitting: Physician Assistant

## 2023-05-02 DIAGNOSIS — N760 Acute vaginitis: Secondary | ICD-10-CM

## 2023-05-02 DIAGNOSIS — B9689 Other specified bacterial agents as the cause of diseases classified elsewhere: Secondary | ICD-10-CM

## 2023-05-02 MED ORDER — METRONIDAZOLE 500 MG PO TABS
500.0000 mg | ORAL_TABLET | Freq: Two times a day (BID) | ORAL | 0 refills | Status: AC
Start: 2023-05-02 — End: 2023-05-09

## 2023-05-02 NOTE — Progress Notes (Signed)
 E-Visit for Vaginal Symptoms  We are sorry that you are not feeling well. Here is how we plan to help! Based on what you shared with me it looks like you: May have a vaginosis due to bacteria  Vaginosis is an inflammation of the vagina that can result in discharge, itching and pain. The cause is usually a change in the normal balance of vaginal bacteria or an infection. Vaginosis can also result from reduced estrogen levels after menopause.  The most common causes of vaginosis are:   Bacterial vaginosis which results from an overgrowth of one on several organisms that are normally present in your vagina.   Yeast infections which are caused by a naturally occurring fungus called candida.   Vaginal atrophy (atrophic vaginosis) which results from the thinning of the vagina from reduced estrogen levels after menopause.   Trichomoniasis which is caused by a parasite and is commonly transmitted by sexual intercourse.  Factors that increase your risk of developing vaginosis include: Medications, such as antibiotics and steroids Uncontrolled diabetes Use of hygiene products such as bubble bath, vaginal spray or vaginal deodorant Douching Wearing damp or tight-fitting clothing Using an intrauterine device (IUD) for birth control Hormonal changes, such as those associated with pregnancy, birth control pills or menopause Sexual activity Having a sexually transmitted infection  Your treatment plan is Metronidazole or Flagyl 500mg  twice a day for 7 days.  I have electronically sent this prescription into the pharmacy that you have chosen.  Be sure to take all of the medication as directed. Stop taking any medication if you develop a rash, tongue swelling or shortness of breath. Mothers who are breast feeding should consider pumping and discarding their breast milk while on these antibiotics. However, there is no consensus that infant exposure at these doses would be harmful.  Remember that  medication creams can weaken latex condoms. SABRA   HOME CARE:  Good hygiene may prevent some types of vaginosis from recurring and may relieve some symptoms:  Avoid baths, hot tubs and whirlpool spas. Rinse soap from your outer genital area after a shower, and dry the area well to prevent irritation. Don't use scented or harsh soaps, such as those with deodorant or antibacterial action. Avoid irritants. These include scented tampons and pads. Wipe from front to back after using the toilet. Doing so avoids spreading fecal bacteria to your vagina.  Other things that may help prevent vaginosis include:  Don't douche. Your vagina doesn't require cleansing other than normal bathing. Repetitive douching disrupts the normal organisms that reside in the vagina and can actually increase your risk of vaginal infection. Douching won't clear up a vaginal infection. Use a latex condom. Both female and female latex condoms may help you avoid infections spread by sexual contact. Wear cotton underwear. Also wear pantyhose with a cotton crotch. If you feel comfortable without it, skip wearing underwear to bed. Yeast thrives in [employer] Your symptoms should improve in the next day or two.  GET HELP RIGHT AWAY IF:  You have pain in your lower abdomen ( pelvic area or over your ovaries) You develop nausea or vomiting You develop a fever Your discharge changes or worsens You have persistent pain with intercourse You develop shortness of breath, a rapid pulse, or you faint.  These symptoms could be signs of problems or infections that need to be evaluated by a medical provider now.  MAKE SURE YOU   Understand these instructions. Will watch your condition. Will get help right  away if you are not doing well or get worse.  Thank you for choosing an e-visit.  Your e-visit answers were reviewed by a board certified advanced clinical practitioner to complete your personal care plan. Depending upon the  condition, your plan could have included both over the counter or prescription medications.  Please review your pharmacy choice. Make sure the pharmacy is open so you can pick up prescription now. If there is a problem, you may contact your provider through [employer] and have the prescription routed to another pharmacy.  Your safety is important to us . If you have drug allergies check your prescription carefully.   For the next 24 hours you can use MyChart to ask questions about today's visit, request a non-urgent call back, or ask for a work or school excuse. You will get an email in the next two days asking about your experience. I hope that your e-visit has been valuable and will speed your recovery.  I have spent 5 minutes in review of e-visit questionnaire, review and updating patient chart, medical decision making and response to patient.   Lynn CHRISTELLA Dickinson, PA-C

## 2023-06-22 ENCOUNTER — Ambulatory Visit
Admission: RE | Admit: 2023-06-22 | Discharge: 2023-06-22 | Disposition: A | Discharge status: Home / Self Care | Source: Ambulatory Visit | Attending: Family Medicine | Admitting: Family Medicine

## 2023-06-22 VITALS — BP 139/93 | HR 86 | Temp 99.6°F | Resp 20

## 2023-06-22 DIAGNOSIS — B349 Viral infection, unspecified: Secondary | ICD-10-CM | POA: Diagnosis not present

## 2023-06-22 LAB — POC COVID19/FLU A&B COMBO
Covid Antigen, POC: NEGATIVE
Influenza A Antigen, POC: NEGATIVE
Influenza B Antigen, POC: NEGATIVE

## 2023-06-22 MED ORDER — CETIRIZINE HCL 10 MG PO TABS: 10.0000 mg | ORAL_TABLET | Freq: Every day | ORAL | 0 refills | Status: AC

## 2023-06-22 MED ORDER — PROMETHAZINE-DM 6.25-15 MG/5ML PO SYRP: 5.0000 mL | ORAL_SOLUTION | Freq: Three times a day (TID) | ORAL | 0 refills | Status: DC | PRN

## 2023-06-22 MED ORDER — PREDNISONE 20 MG PO TABS: ORAL_TABLET | ORAL | 0 refills | Status: AC

## 2023-06-22 NOTE — Discharge Instructions (Signed)
 For sore throat or cough try using a honey-based tea. Use 3 teaspoons of honey with juice squeezed from half lemon. Place shaved pieces of ginger into 1/2-1 cup of water and warm over stove top. Then mix the ingredients and repeat every 4 hours as needed. Please take Tylenol 500mg -650mg  once every 6 hours for fevers, aches and pains. Hydrate very well with at least 2 liters (64 ounces) of water. Eat light meals such as soups (chicken and noodles, chicken wild rice, vegetable).  Do not eat any foods that you are allergic to.  Start an antihistamine like Zyrtec (10mg  daily) for postnasal drainage, sinus congestion.  You can take this together with prednisone and cough syrup.

## 2023-06-22 NOTE — ED Provider Notes (Signed)
 [name] URG[facility]ARE [facility]  Note:  This document was prepared using Conservation officer, historic buildings and may include unintentional dictation errors.  MRN: [id number] DOB: [birth year]  Subjective:   Lynn Roth is a 23 y.o. female presenting for 2-day history of acute onset coughing, malaise, fatigue.  Coughing is elicited chest pain, posttussive emesis.  She is also had some diarrhea and headaches.  No shortness of breath, wheezing.  No asthma.  No fevers, body pains.  Would like COVID and flu testing.  No smoking of any kind including cigarettes, cigars, vaping, marijuana use.  No known sick exposures.  She does have a history of chronic allergic rhinitis.  Does not take anything consistently for this.  No current facility-administered medications for this encounter.  Current Outpatient Medications:    hydrocortisone (ANUSOL-HC) 25 MG suppository, Place 1 suppository (25 mg total) rectally every 12 (twelve) hours. (Patient not taking: Reported on 09/21/2022), Disp: 12 suppository, Rfl: 1   levocetirizine (XYZAL) 5 MG tablet, Take 1 tablet (5 mg total) by mouth every evening., Disp: 30 tablet, Rfl: 11   Phenylephrine-Witch Hazel (PREPARATION H) 0.25-50 % GEL, Apply 1 Application topically as needed., Disp: 25.5 g, Rfl: 1   No Known Allergies  Past Medical History:  Diagnosis Date   Allergy started 08/04/2007   Otitis media 03/19/2005   Sickle cell trait (HCC)      Past Surgical History:  Procedure Laterality Date   NO PAST SURGERIES      Family History  Problem Relation Age of Onset   Hypertension Mother 37       medication.   Asthma Brother    Hypertension Maternal Aunt    Sickle cell anemia Maternal Uncle    Diabetes Maternal Grandmother    Hypertension Maternal Grandfather    Vision loss Maternal Grandfather        glacoma   Hypertension Paternal Grandmother    Kidney disease Neg Hx    Stroke Neg Hx    Hyperlipidemia Neg Hx    Stomach cancer Neg Hx     Colon cancer Neg Hx    Esophageal cancer Neg Hx    Pancreatic cancer Neg Hx    Liver disease Neg Hx    Inflammatory bowel disease Neg Hx    Rectal cancer Neg Hx     Social History   Tobacco Use   Smoking status: Never   Smokeless tobacco: Never  Vaping Use   Vaping status: Never Used  Substance Use Topics   Alcohol use: No   Drug use: No    ROS   Objective:   Vitals: BP (!) 139/93 (BP Location: Left Arm)   Pulse 86   Temp 99.6 F (37.6 C) (Oral)   Resp 20   LMP 06/04/2023   SpO2 96%   Physical Exam Constitutional:      General: She is not in acute distress.    Appearance: Normal appearance. She is well-developed and normal weight. She is not ill-appearing, toxic-appearing or diaphoretic.  HENT:     Head: Normocephalic and atraumatic.     Right Ear: Tympanic membrane, ear canal and external ear normal. No drainage or tenderness. No middle ear effusion. There is no impacted cerumen. Tympanic membrane is not erythematous or bulging.     Left Ear: Tympanic membrane, ear canal and external ear normal. No drainage or tenderness.  No middle ear effusion. There is no impacted cerumen. Tympanic membrane is not erythematous or bulging.  Nose: Nose normal. No congestion or rhinorrhea.     Mouth/Throat:     Mouth: Mucous membranes are moist. No oral lesions.     Pharynx: No pharyngeal swelling, oropharyngeal exudate, posterior oropharyngeal erythema or uvula swelling.     Tonsils: No tonsillar exudate or tonsillar abscesses.  Eyes:     General: No scleral icterus.       Right eye: No discharge.        Left eye: No discharge.     Extraocular Movements: Extraocular movements intact.     Right eye: Normal extraocular motion.     Left eye: Normal extraocular motion.     Conjunctiva/sclera: Conjunctivae normal.  Cardiovascular:     Rate and Rhythm: Normal rate and regular rhythm.     Heart sounds: Normal heart sounds. No murmur heard.    No friction rub. No gallop.   Pulmonary:     Effort: Pulmonary effort is normal. No respiratory distress.     Breath sounds: No stridor. No wheezing, rhonchi or rales.  Chest:     Chest wall: No tenderness.  Musculoskeletal:     Cervical back: Normal range of motion and neck supple.  Lymphadenopathy:     Cervical: No cervical adenopathy.  Skin:    General: Skin is warm and dry.  Neurological:     General: No focal deficit present.     Mental Status: She is alert and oriented to person, place, and time.  Psychiatric:        Mood and Affect: Mood normal.        Behavior: Behavior normal.     Assessment and Plan :   PDMP not reviewed this encounter.  1. Acute viral syndrome    Given the significant respiratory symptoms of posttussive emesis, coughing spells, cough that elicits chest pain I offered an oral prednisone course.  This is also in the context of her chronic allergic rhinitis that she is not treating.  Otherwise, recommend supportive care for an acute viral syndrome.  COVID, flu testing pending at time of discharge.  Will update patient later today.  Deferred imaging given clear cardiopulmonary exam, hemodynamically stable vital signs.  Counseled patient on potential for adverse effects with medications prescribed/recommended today, ER and return-to-clinic precautions discussed, patient verbalized understanding.    Wallis Bamberg, [place] 06/22/23 [phone]

## 2023-06-22 NOTE — ED Triage Notes (Signed)
 Pt c/o cough until she vomits, HA, diarrhea-sx started 2 days ago-denies known fever-NAD-steady gait

## 2023-10-12 ENCOUNTER — Telehealth: Admitting: Physician Assistant

## 2023-10-12 DIAGNOSIS — N760 Acute vaginitis: Secondary | ICD-10-CM | POA: Diagnosis not present

## 2023-10-12 DIAGNOSIS — B9689 Other specified bacterial agents as the cause of diseases classified elsewhere: Secondary | ICD-10-CM

## 2023-10-12 MED ORDER — METRONIDAZOLE 500 MG PO TABS: 500.0000 mg | ORAL_TABLET | Freq: Two times a day (BID) | ORAL | 0 refills | Status: AC

## 2023-10-12 NOTE — Progress Notes (Signed)

## 2023-10-12 NOTE — Progress Notes (Signed)
 I have spent 5 minutes in review of e-visit questionnaire, review and updating patient chart, medical decision making and response to patient.   Piedad Climes, PA-C

## 2024-03-27 ENCOUNTER — Telehealth: Admitting: Family Medicine

## 2024-03-27 ENCOUNTER — Encounter: Payer: Self-pay | Admitting: Family Medicine

## 2024-03-27 DIAGNOSIS — R197 Diarrhea, unspecified: Secondary | ICD-10-CM

## 2024-03-27 DIAGNOSIS — R112 Nausea with vomiting, unspecified: Secondary | ICD-10-CM

## 2024-03-27 MED ORDER — ONDANSETRON 4 MG PO TBDP: 4.0000 mg | ORAL_TABLET | Freq: Three times a day (TID) | ORAL | 0 refills | Status: AC | PRN

## 2024-03-27 NOTE — Progress Notes (Signed)
 We are sorry that you are not feeling well.  Here is how we plan to help!  Based on what you have shared with me it looks like you have Acute Infectious Diarrhea.  Most cases of acute diarrhea are due to infections with virus and bacteria and are self-limited conditions lasting less than 14 days.  For your symptoms you may take Imodium 2 mg tablets that are over the counter at your local pharmacy. Take two tablet now and then one after each loose stool up to 6 a day.  Antibiotics are not needed for most people with diarrhea.  Optional: Zofran  4 mg 1 tablet every 8 hours as needed for nausea and vomiting    HOME CARE We recommend changing your diet to help with your symptoms for the next few days. Drink plenty of fluids that contain water salt and sugar. Sports drinks such as Gatorade may help.  You may try broths, soups, bananas, applesauce, soft breads, mashed potatoes or crackers.  You are considered infectious for as long as the diarrhea continues. Hand washing or use of alcohol based hand sanitizers is recommend. It is best to stay out of work or school until your symptoms stop.   GET HELP RIGHT AWAY If you have dark yellow colored urine or do not pass urine frequently you should drink more fluids.   If your symptoms worsen  If you feel like you are going to pass out (faint) You have a new problem  MAKE SURE YOU  Understand these instructions. Will watch your condition. Will get help right away if you are not doing well or get worse.  Your e-visit answers were reviewed by a board certified advanced clinical practitioner to complete your personal care plan.  Depending on the condition, your plan could have included both over the counter or prescription medications.  If there is a problem please reply  once you have received a response from your provider.  Your safety is important to us .  If you have drug allergies check your prescription carefully.    You can use MyChart to  ask questions about today's visit, request a non-urgent call back, or ask for a work or school excuse for 24 hours related to this e-Visit. If it has been greater than 24 hours you will need to follow up with your provider, or enter a new e-Visit to address those concerns.   You will get an e-mail in the next two days asking about your experience.  I hope that your e-visit has been valuable and will speed your recovery. Thank you for using e-visits.   I have spent 5 minutes in review of e-visit questionnaire, review and updating patient chart, medical decision making and response to patient.   Chiquita CHRISTELLA Barefoot, NP

## 2024-05-17 ENCOUNTER — Telehealth: Admitting: Physician Assistant

## 2024-05-17 DIAGNOSIS — R300 Dysuria: Secondary | ICD-10-CM

## 2024-05-17 DIAGNOSIS — R10A0 Flank pain, unspecified side: Secondary | ICD-10-CM

## 2024-05-17 NOTE — Progress Notes (Signed)
" °  Because of ongoing evaluation for flank pain and now urinary symptoms which raises concern for potential kidney infection, I feel your condition warrants further evaluation and I recommend that you be seen in a face-to-face visit.   NOTE: There will be NO CHARGE for this E-Visit   If you are having a true medical emergency, please call 911.     For an urgent face to face visit, [facility] has multiple urgent care centers for your convenience.  Click the link below for the full list of locations and hours, walk-in wait times, appointment scheduling options and driving directions:  Urgent Care - [place], [place], [place], [place], [place], [place]  [place]     Your MyChart E-visit questionnaire answers were reviewed by a board certified advanced clinical practitioner to complete your personal care plan based on your specific symptoms.    Thank you for using e-Visits.    "
